# Patient Record
Sex: Female | Born: 1970 | Race: White | Hispanic: No | Marital: Married
Health system: Midwestern US, Community
[De-identification: ages and names within clinical notes are randomized; demographics above are authoritative.]

## PROBLEM LIST (undated history)

## (undated) DIAGNOSIS — F419 Anxiety disorder, unspecified: Secondary | ICD-10-CM

## (undated) DIAGNOSIS — Z1231 Encounter for screening mammogram for malignant neoplasm of breast: Secondary | ICD-10-CM

## (undated) DIAGNOSIS — N393 Stress incontinence (female) (male): Secondary | ICD-10-CM

## (undated) DIAGNOSIS — F32A Depression, unspecified: Secondary | ICD-10-CM

## (undated) DIAGNOSIS — E785 Hyperlipidemia, unspecified: Secondary | ICD-10-CM

## (undated) DIAGNOSIS — K219 Gastro-esophageal reflux disease without esophagitis: Secondary | ICD-10-CM

## (undated) DIAGNOSIS — K649 Unspecified hemorrhoids: Secondary | ICD-10-CM

## (undated) DIAGNOSIS — Z8489 Family history of other specified conditions: Secondary | ICD-10-CM

## (undated) DIAGNOSIS — J069 Acute upper respiratory infection, unspecified: Secondary | ICD-10-CM

## (undated) DIAGNOSIS — K5904 Chronic idiopathic constipation: Secondary | ICD-10-CM

## (undated) MED ORDER — FLUOXETINE 20 MG CAP
20 mg | ORAL_CAPSULE | Freq: Every day | ORAL | Status: DC
Start: ? — End: 2014-03-17

## (undated) MED ORDER — FLUOXETINE 20 MG CAP
20 mg | ORAL_CAPSULE | Freq: Every day | ORAL | Status: AC
Start: ? — End: 2013-01-19

## (undated) MED ORDER — DROSPIRENONE 3 MG-ETHINYL ESTRADIOL 0.03 MG TABLET
PACK | Freq: Every day | ORAL | Status: DC
Start: ? — End: 2014-03-17

## (undated) MED ORDER — DROSPIRENONE 3 MG-ETHINYL ESTRADIOL 0.03 MG TABLET
PACK | Freq: Every day | ORAL | Status: DC
Start: ? — End: 2013-11-05

---
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
## 2001-11-06 HISTORY — PX: HEMORROIDECTOMY: SUR656

## 2008-11-06 HISTORY — PX: INCONTINENCE SURGERY: SHX676

## 2011-11-07 HISTORY — PX: COLONOSCOPY WITH ESOPHAGOGASTRODUODENOSCOPY (EGD): SHX5779

## 2011-12-21 LAB — HCG QL SERUM: HCG, Ql.: NEGATIVE

## 2011-12-21 LAB — CBC WITH AUTOMATED DIFF
ABS. BASOPHILS: 0.1 10*3/uL (ref 0.0–0.1)
ABS. EOSINOPHILS: 0.3 10*3/uL (ref 0.0–0.4)
ABS. LYMPHOCYTES: 1.5 10*3/uL (ref 0.8–3.5)
ABS. MONOCYTES: 0.4 10*3/uL (ref 0.0–1.0)
ABS. NEUTROPHILS: 1.7 10*3/uL — ABNORMAL LOW (ref 1.8–8.0)
BASOPHILS: 1 % (ref 0–1)
EOSINOPHILS: 8 % — ABNORMAL HIGH (ref 0–7)
HCT: 39.3 % (ref 35.0–47.0)
HGB: 13.3 g/dL (ref 11.5–16.0)
LYMPHOCYTES: 38 % (ref 12–49)
MCH: 31.3 PG (ref 26.0–34.0)
MCHC: 33.8 g/dL (ref 30.0–36.5)
MCV: 92.5 FL (ref 80.0–99.0)
MONOCYTES: 11 % (ref 5–13)
NEUTROPHILS: 42 % (ref 32–75)
PLATELET: 234 10*3/uL (ref 150–400)
RBC: 4.25 M/uL (ref 3.80–5.20)
RDW: 12.5 % (ref 11.5–14.5)
WBC: 3.9 10*3/uL (ref 3.6–11.0)

## 2011-12-21 NOTE — Progress Notes (Signed)
Received message that pt call in with medicine updates.  PTA med list updated.  Pt reported no bowel prep ordered.

## 2011-12-21 NOTE — Progress Notes (Signed)
[facility]  PREOPERATIVE INSTRUCTIONS    Surgery Date:   12/28/11  Surgery arrival time given by surgeon: NO     If ???no???,[facility] staff will call you between 4 PM- 8 PM the day before surgery with    your arrival time. If your surgery is on a Monday, we will call you the preceding Friday.       Please call [phone] after 8 PM if you did not receive your arrival time.    1. Please report at the designated time to the 2nd Floor Admitting Desk.   2. You must have a responsible adult to drive you home. You need to have a responsible adult to stay with you the first 24 hours after surgery if you are going home the same day of your surgery and you should not drive a car for 24 hours following your surgery.  3. Nothing to eat or drink after midnight the night before surgery. This includes no water, gum, mints, coffee, juice, etc.  Please note special instructions, if applicable, below for medications.  4. MEDICATIONS TO TAKE THE MORNING OF SURGERY WITH A SIP OF WATER: Prilosec  5. No alcoholic beverages 24 hours before or after your surgery.  6. If you are being admitted to the hospital,please leave personal belongings/luggage in your car until you have an assigned hospital room number.  7. Stop Aspirin and/or any non-steroidal anti-inflammatory drugs (i.e. Ibuprofen, Naproxen, Advil, Aleve) as directed by your surgeon.  You may take Tylenol.        Stop herbal supplements 1 week prior to  surgery.  8. If you are currently taking Plavix, Coumadin,or any other blood-thinning/anticoagulant medication contact your surgeon for instructions.  9. Please wear comfortable clothes. Wear your glasses instead of contacts. We ask that all money, jewelry and valuables be left at home. Wear no make up, particularly mascara, the day of surgery.   10.  All body piercings, rings,and jewelry need to be removed and left at home.    Please wear your hair loose or down. Please no pony-tails, buns, or any metal hair accessories. If you shower the morning  of surgery, please do not apply any lotions, powders, or deodorants afterwards.   Do not shave any body area within 24 hours of your surgery.  11. Please follow all instructions to avoid any potential surgical cancellation.  12.  Should your physical condition change, (i.e. fever, cold, flu, etc.) please notify your surgeon as soon as possible.  13. It is important to be on time. If a situation occurs where you may be delayed, please call:  [phone]  on the day of surgery.  14. The Preadmission Testing staff can be reached at [phone].Marland KitchenBeth M  15. Special instructions: Bring insurance cards and picture id.  Valet parking.  Pain brochure reviewed and given to patient.  16. Ask Dr. Victorino Dike if a Bowel prep was ordered.  17. Call with Prilosec does and name of BCP's or bring day of surgery.    The patient was contacted  in person.   She  verbalize  understanding of all instructions does not  need reinforcement.

## 2011-12-22 LAB — TYPE AND SCREEN
ABO/Rh: A POS
Antibody Screen: NEGATIVE

## 2011-12-22 LAB — TYPE & SCREEN
ABO/Rh(D): A POS
Antibody screen: NEGATIVE

## 2011-12-28 ENCOUNTER — Inpatient Hospital Stay: Payer: BLUE CROSS/BLUE SHIELD

## 2011-12-28 LAB — HCG URINE, QL. - POC: Pregnancy test,urine (POC): NEGATIVE

## 2011-12-28 MED ORDER — FENTANYL CITRATE (PF) 50 MCG/ML IJ SOLN
50 mcg/mL | INTRAMUSCULAR | Status: DC | PRN
Start: 2011-12-28 — End: 2011-12-28

## 2011-12-28 MED ORDER — NALOXONE 0.4 MG/ML INJECTION
0.4 mg/mL | INTRAMUSCULAR | Status: DC | PRN
Start: 2011-12-28 — End: 2011-12-28

## 2011-12-28 MED ORDER — OXYCODONE-ACETAMINOPHEN 5 MG-325 MG TAB
5-325 mg | ORAL_TABLET | ORAL | Status: DC | PRN
Start: 2011-12-28 — End: 2013-11-05

## 2011-12-28 MED ORDER — BUPIVACAINE-EPINEPHRINE (PF) 0.25 %-1:200,000 IJ SOLN
0.25 %-1:200,000 | Freq: Once | INTRAMUSCULAR | Status: AC
Start: 2011-12-28 — End: 2011-12-28
  Administered 2011-12-28: 13:00:00 via SUBCUTANEOUS

## 2011-12-28 MED ORDER — PROMETHAZINE 25 MG/ML INJECTION
25 mg/mL | INTRAMUSCULAR | Status: DC | PRN
Start: 2011-12-28 — End: 2011-12-28

## 2011-12-28 MED ORDER — LIDOCAINE (PF) 10 MG/ML (1 %) IJ SOLN
10 mg/mL (1 %) | INTRAMUSCULAR | Status: DC | PRN
Start: 2011-12-28 — End: 2011-12-28

## 2011-12-28 MED ORDER — HYDROMORPHONE (PF) 1 MG/ML IJ SOLN
1 mg/mL | INTRAMUSCULAR | Status: DC | PRN
Start: 2011-12-28 — End: 2011-12-28
  Administered 2011-12-28 (×2): via INTRAVENOUS

## 2011-12-28 MED ORDER — OXYCODONE-ACETAMINOPHEN 5 MG-325 MG TAB
5-325 mg | ORAL | Status: AC
Start: 2011-12-28 — End: 2011-12-28
  Administered 2011-12-28: 15:00:00 via ORAL

## 2011-12-28 MED ORDER — FLUMAZENIL 0.1 MG/ML IV SOLN
0.1 mg/mL | INTRAVENOUS | Status: DC | PRN
Start: 2011-12-28 — End: 2011-12-28

## 2011-12-28 MED ORDER — ONDANSETRON (PF) 4 MG/2 ML INJECTION
4 mg/2 mL | INTRAMUSCULAR | Status: DC | PRN
Start: 2011-12-28 — End: 2011-12-28

## 2011-12-28 MED ADMIN — ceFAZolin in 0.9% NS (ANCEF) IVPB Soln 2 g: INTRAVENOUS | @ 13:00:00 | NDC 24200060403

## 2011-12-28 MED ADMIN — lactated ringers infusion: INTRAVENOUS | @ 12:00:00 | NDC 00409795309

## 2011-12-28 MED FILL — LACTATED RINGERS IV: INTRAVENOUS | Qty: 1000

## 2011-12-28 MED FILL — HYDROMORPHONE (PF) 1 MG/ML IJ SOLN: 1 mg/mL | INTRAMUSCULAR | Qty: 1

## 2011-12-28 MED FILL — DEXAMETHASONE SODIUM PHOSPHATE 10 MG/ML IJ SOLN: 10 mg/mL | INTRAMUSCULAR | Qty: 1

## 2011-12-28 MED FILL — LIDOCAINE (PF) 20 MG/ML (2 %) IV SYRINGE: 100 mg/5 mL (2 %) | INTRAVENOUS | Qty: 5

## 2011-12-28 MED FILL — MIDAZOLAM 1 MG/ML IJ SOLN: 1 mg/mL | INTRAMUSCULAR | Qty: 2

## 2011-12-28 MED FILL — FENTANYL CITRATE (PF) 50 MCG/ML IJ SOLN: 50 mcg/mL | INTRAMUSCULAR | Qty: 5

## 2011-12-28 MED FILL — DIPRIVAN 10 MG/ML INTRAVENOUS EMULSION: 10 mg/mL | INTRAVENOUS | Qty: 20

## 2011-12-28 MED FILL — CEFAZOLIN 1 GRAM SOLUTION FOR INJECTION: 1 gram | INTRAMUSCULAR | Qty: 2

## 2011-12-28 MED FILL — MORPHINE 10 MG/ML SYRINGE: 10 mg/mL | INTRAMUSCULAR | Qty: 1

## 2011-12-28 MED FILL — ONDANSETRON (PF) 4 MG/2 ML INJECTION: 4 mg/2 mL | INTRAMUSCULAR | Qty: 2

## 2011-12-28 MED FILL — MARCAINE-EPINEPHRINE (PF) 0.25 %-1:200,000 INJECTION SOLUTION: 0.25 %-1:200,000 | INTRAMUSCULAR | Qty: 30

## 2011-12-28 NOTE — Op Note (Signed)
TVT Operative summary    Name: Leslie Harrell   Medical Record Number: [id number]   Date of Birth: 1971-02-21  Date of Surgery: 12/28/2011    Preoperative Diagnosis: STRESS URINARY INCONTINENCE    Postoperative Diagnosis: STRESS URINARY INCONTINENCE    Surgeon:  Jossie Ng, MD     Assistant:  none    Anesthesia: General    Procedure: Procedure(s) with comments:  URETHROPEXY SLING TAPING - TENSION FREE VAGINAL TAPING OBTURATOR     Findings: Hypermobile urethra    Estimated Blood Loss:  less than 50  cc    Drains: none    Pathology /Specimens:  * No specimens in log *    DVT Prophylaxis: SCD Hose    Antibiotic Prophylaxis: Ancef:    INDICATIONS:    Informed consent was obtained for the above procedure, and the patient stated that she had no further questions.    OPERATIVE SUMMARY: The patient was prepped and draped in the dorsal lithotomy position after institution of general anesthesia. A Foley catheter was placed sterilely, and then the mid urethra identified. The sub-urethral space was injected with a dilute solution of Marcaine.    A 2 cm vertical incision was made, and dissection accomplished out to the obturator membrane. It was not punctured. The winged guide was placed and then the TVT-O device was placed on each side using the helical needle passers without difficulty. The urethra was elevated to its normal position and then the slack taken out of the TVT-ABBREVO device. It was then held in place by removing the sleeves and the sutures on each side.    The vaginal epithelium was closed with a running mattress suture of 2-0 Vicryl. The Foley catheter was removed, the bladder was inspected with cystoscopy, and all surfaces appeared to be completely normal. There was prompt spill from both ureteral orifices. There were no signs of injury to the bladder. The scope was removed and the Foley catheter replaced.     Signed by Jossie Ng, MD

## 2011-12-28 NOTE — Progress Notes (Signed)
Patient had 300cc sterile saline instilled into bladder as per order.  Foley catheter then discontinued.  Leg strap removed, left thigh has reddened area from cath secure.      Patient OOB to bathroom to void 125 cc yellow urine with scant pink tinge.  Patient given urine hat to measure output at home.  Patient aware that she should call MD with any difficulty voiding at all.  Patient encouraged to drink plenty of fluids, and affirmed this.    Patient encouraged to cough and deep breath.

## 2011-12-28 NOTE — Progress Notes (Signed)
300 cc of sterile saline instilled into bladder via foley catheter.  Patient attempted to void afterwards with no success.  Patient does not feel the urge to void.  Patient aware of order to recath if patient does not void.  Patient wants to wait to avoid being recatheterized.        Bladder scan for 288 cc. Patient has taken in 360 cc of fluid by mouth.

## 2011-12-28 NOTE — Op Note (Signed)
TVT Operative summary    Name: Leslie Harrell   Medical Record Number: [id number]   Date of Birth: 02/01/1971  Date of Surgery: 12/28/2011    Preoperative Diagnosis: STRESS URINARY INCONTINENCE    Postoperative Diagnosis: STRESS URINARY INCONTINENCE    Surgeon:  Lillion Elbert, MD     Assistant:  none    Anesthesia: General    Procedure: Procedure(s) with comments:  URETHROPEXY SLING TAPING - TENSION FREE VAGINAL TAPING OBTURATOR     Findings: Hypermobile urethra    Estimated Blood Loss:  less than 50  cc    Drains: none    Pathology /Specimens:  * No specimens in log *    DVT Prophylaxis: SCD Hose    Antibiotic Prophylaxis: Ancef:    INDICATIONS:    Informed consent was obtained for the above procedure, and the patient stated that she had no further questions.    OPERATIVE SUMMARY: The patient was prepped and draped in the dorsal lithotomy position after institution of general anesthesia. A Foley catheter was placed sterilely, and then the mid urethra identified. The sub-urethral space was injected with a dilute solution of Marcaine.    A 2 cm vertical incision was made, and dissection accomplished out to the obturator membrane. It was not punctured. The winged guide was placed and then the TVT-O device was placed on each side using the helical needle passers without difficulty. The urethra was elevated to its normal position and then the slack taken out of the TVT-ABBREVO device. It was then held in place by removing the sleeves and the sutures on each side.    The vaginal epithelium was closed with a running mattress suture of 2-0 Vicryl. The Foley catheter was removed, the bladder was inspected with cystoscopy, and all surfaces appeared to be completely normal. There was prompt spill from both ureteral orifices. There were no signs of injury to the bladder. The scope was removed and the Foley catheter replaced.     Signed by Robecca Fulgham, MD

## 2011-12-28 NOTE — Progress Notes (Signed)
Post-Anesthesia Evaluation & Assessment    Visit Vitals   Item Reading   ??? BP 111/46   ??? Pulse 90   ??? Temp 98.2 ??F (36.8 ??C)   ??? Resp 14   ??? Ht 5\' 8"  (1.727 m)   ??? Wt 61.8 kg (136 lb 3.9 oz)   ??? BMI 20.72 kg/m2   ??? SpO2 98%       Nausea/Vomiting: no nausea    Post-operative hydration adequate.    Pain score (VAS): 0    Mental status & Level of consciousness: alert and oriented x 3    Neurological status: moves all extremities, sensation grossly intact    Pulmonary status: airway patent, no supplemental oxygen required    Complications related to anesthesia: none    Patient has met all discharge requirements.    Additional comments:        Robbie Louis, MD

## 2011-12-28 NOTE — Progress Notes (Signed)
Patient still unable to void after initial foley removal at 0915.  Patient has had 480cc of p.o. Fluid.      Foley catheter inserted under sterile technique with the assist of M. Copeland, PCT.  Patient had immediate urine in catheter that is clear, pale yellow.  Patient denied any discomfort during procedure.  Foley tubing secured to l. Leg, and shows dependent drainage of 300cc +.    Patient now to wait 1 hour(12noon) to restart voiding trial.    Husband updated on development and is waiting in the Waiting Room.    Call bell at beside, drink at bedside, IV LR connected.

## 2011-12-28 NOTE — H&P (Signed)
Gynecology History and Physical    Name: Leslie Harrell MRN: [id number] SSN: [id number]    Date of Birth: 1971-06-16  Age: 41 y.o.  Sex: female       Subjective:      Chief complaint:  Urinary incontinence    Hayden is a 41 y.o. Caucasian female with a history of urinary incontinence. Previous workup included Urodynamics. Previous treatment measures included kegel exercises. She is admitted for Procedure(s) (LRB):  URETHROPEXY SLING TAPING (N/A).    The current method of family planning is oral contraceptive (estrogen/progesterone).    OB History     Grav Para Term Preterm Abortions TAB SAB Ect Mult Living                     Past Medical History   Diagnosis Date   ??? GERD (gastroesophageal reflux disease)    ??? Other ill-defined conditions      PMDD     Past Surgical History   Procedure Date   ??? Hx tonsillectomy 1986   ??? Hx appendectomy 1987   ??? Hx cesarean section 1999, 2002   ??? Hx other surgical      hemorroidectomy     Social History     Occupational History   ??? Not on file.     Social History Main Topics   ??? Smoking status: Never Smoker    ??? Smokeless tobacco: Never Used   ??? Alcohol Use: 3.5 oz/week     7 Glasses of wine per week      7/wk   ??? Drug Use: No   ??? Sexually Active:      No family history on file.     Allergies   Allergen Reactions   ??? Codeine Other (comments)     "Bad headaches"     Prior to Admission medications    Medication Sig Start Date End Date Taking? Authorizing Provider   OMEPRAZOLE (PRILOSEC PO) Take 20 mg by mouth Daily (before breakfast).   Yes Historical Provider   FLUoxetine (PROZAC) 20 mg capsule Take 20 mg by mouth nightly.     Yes Historical Provider   loratadine (CLARITIN) 10 mg tablet Take 10 mg by mouth daily.     Yes Historical Provider   multivitamin (ONE A DAY) tablet Take 1 Tab by mouth daily.     Yes Historical Provider   Bifidobacterium Infantis (ALIGN) 4 mg cap Take 1 Cap by mouth daily.     Yes Historical Provider   ETHINYL ESTRADIOL/DROSPIRENONE (ZARAH PO) Take 1 Tab by mouth  daily.     Yes Historical Provider        Review of Systems:  A comprehensive review of systems was negative except for that written in the History of Present Illness.     Objective:     Filed Vitals:    12/28/11 0634   BP: 110/62   Pulse: 66   Temp: 98.5 ??F (36.9 ??C)   Resp: 16   Height: 5\' 8"  (1.727 m)   Weight: 61.8 kg (136 lb 3.9 oz)   SpO2: 99%       Physical Exam:  Heart: Regular rate and rhythm  Lung: clear to auscultation throughout lung fields, no wheezes, no rales, no rhonchi and normal respiratory effort  Abdomen: soft, nontender, without masses or organomegaly  External Genitalia: normal general appearance  Urinary system: urethral meatus normal  Vagina: normal mucosa without prolapse or lesions  Cervix: normal appearance  Adnexa: normal  bimanual exam  Uterus: normal single, nontender    Assessment:     Urinary incontinence with pure stress incontinence by Urodynamic testing.    Plan:     Procedure(s) (LRB):  URETHROPEXY SLING TAPING (N/A)  Discussed the risks of surgery including the risks of bleeding, infection, deep vein thrombosis, and surgical injuries to internal organs including but not limited to the bowels, bladder, rectum, and female reproductive organs. The patient understands the risks; any and all questions were answered to the patient's satisfaction.    Signed By:  Jossie Ng, MD     December 28, 2011

## 2012-10-21 NOTE — Progress Notes (Signed)
Annual exam ages 43-64    Leslie Harrell is a G3 P60,  41 y.o. female WHITE OR CAUCASIAN Patient's last menstrual period was 09/24/2012..    She presents for her annual checkup. She is having no significant problems.    With regard to the Gardasil vaccine, she is older than the age for which it is FDA approved.    Menstrual status:    Her periods are moderate in flow. She is using three to five pads or tampons per day, usually regular and last 26-30 days.    She denies dysmenorrhea.    She reports no premenstrual symptoms.    Contraception:    The current method of family planning is OCP (estrogen/progesterone).    Sexual history:    She  reports that she currently engages in sexual activity. She reports using the following method of birth control/protection: Pill.    Medical conditions:    Since her last annual GYN exam about one year ago, she has not the following changes in her health history: bladder sling performed 12/2010   Pap and Mammogram History:    Her most recent Pap smear was normal, obtained 2 year(s) ago.    The patient had her mammogram today in our office.    Breast Cancer History/Substance Abuse: negative    Osteoporosis History:    Family history does not include a first or second degree relative with osteopenia or osteoporosis.    A bone density scan  never been obtained   Past Medical History   Diagnosis Date   ??? GERD (gastroesophageal reflux disease)    ??? PMDD (premenstrual dysphoric disorder)    ??? Bursitis      Past Surgical History   Procedure Laterality Date   ??? Hx tonsil and adenoidectomy  1986   ??? Hx appendectomy  1987   ??? Hx cesarean section  1999, 2002   ??? Hx cystectomy       ovarian   ??? Hx hemorrhoidectomy     ??? Hx gyn       TVT   ??? Hx urological  12/2010     bladder sling       Current Outpatient Prescriptions   Medication Sig Dispense Refill   ??? FLUoxetine (PROZAC) 20 mg capsule Take 20 mg by mouth nightly.         ??? loratadine (CLARITIN) 10 mg tablet Take 10 mg by mouth daily.          ??? multivitamin (ONE A DAY) tablet Take 1 Tab by mouth daily.         ??? Bifidobacterium Infantis (ALIGN) 4 mg cap Take 1 Cap by mouth daily.         ??? ETHINYL ESTRADIOL/DROSPIRENONE (ZARAH PO) Take 1 Tab by mouth daily.         ??? oxyCODONE-acetaminophen (PERCOCET) 5-325 mg per tablet Take 1-2 Tabs by mouth every four (4) hours as needed for Pain.  28 Tab  0   ??? OMEPRAZOLE (PRILOSEC PO) Take 20 mg by mouth Daily (before breakfast).         No current facility-administered medications for this visit.     Allergies: Codeine     Tobacco History:  reports that she has never smoked. She has never used smokeless tobacco.  Alcohol Abuse:  reports that she drinks about 3.5 ounces of alcohol per week.  Drug Abuse:  reports that she does not use illicit drugs.    Family Medical/Cancer History:  Family History   Problem Relation Age of Onset   ??? Breast Cancer Maternal Grandmother    ??? Diabetes Father    ??? Thyroid Disease Mother    ??? Cancer Mother      thyroid        Review of Systems - History obtained from the patient  Constitutional: negative for weight loss, fever, night sweats  HEENT: negative for hearing loss, earache, congestion, snoring, sorethroat  CV: negative for chest pain, palpitations, edema  Resp: negative for cough, shortness of breath, wheezing  GI: negative for change in bowel habits, abdominal pain, black or bloody stools  GU: negative for frequency, dysuria, hematuria, vaginal discharge  MSK: negative for back pain, joint pain, muscle pain  Breast: negative for breast lumps, nipple discharge, galactorrhea  Skin :negative for itching, rash, hives  Neuro: negative for dizziness, headache, confusion, weakness  Psych: negative for anxiety, depression, change in mood  Heme/lymph: negative for bleeding, bruising, pallor    Physical Exam    Ht 5\' 8"  (1.727 m)   Wt 132 lb (59.875 kg)   BMI 20.08 kg/m2   LMP 09/24/2012    Constitutional  ?? Appearance: well-nourished, well developed, alert, in no acute distress     HENT  ?? Head and Face: appears normal    Neck  ?? Inspection/Palpation: normal appearance, no masses or tenderness  ?? Lymph Nodes: no lymphadenopathy present  ?? Thyroid: gland size normal, nontender, no nodules or masses present on palpation    Chest  ?? Respiratory Effort: breathing labored  ?? Auscultation:     Cardiovascular  ?? Heart:  Auscultation:  Breasts  ?? Inspection of Breasts: breasts symmetrical, no skin changes, no discharge present, nipple appearance normal, no skin retraction present  ?? Palpation of Breasts and Axillae: no masses present on palpation, no breast tenderness  ?? Axillary Lymph Nodes: no lymphadenopathy present    Gastrointestinal  ?? Abdominal Examination: abdomen non-tender to palpation, normal bowel sounds, no masses present  ?? Liver and spleen: no hepatomegaly present, spleen not palpable  ?? Hernias: no hernias identified    Skin  ?? General Inspection: no rash, no lesions identified    Neurologic/Psychiatric  ?? Mental Status:  ?? Orientation: grossly oriented to person, place and time  ?? Mood and Affect: mood normal, affect appropriate    Genitourinary  ?? External Genitalia: normal appearance for age, no discharge present, no tenderness present, no inflammatory lesions present, no masses present, no atrophy present  ?? Vagina: normal vaginal vault without central or paravaginal defects, no discharge present, no inflammatory lesions present, no masses present  ?? Bladder: non-tender to palpation  ?? Urethra: appears normal  ?? Cervix: normal   ?? Uterus: normal size, shape and consistency  ?? Adnexa: no adnexal tenderness present, no adnexal masses present  ?? Perineum: perineum within normal limits, no evidence of trauma, no rashes or skin lesions present  ?? Anus: anus within normal limits, no hemorrhoids present  ?? Inguinal Lymph Nodes: no lymphadenopathy present    Assessment:  Routine gynecologic examination  Her current medical status is satisfactory with no evidence of significant gynecologic  issues.    Plan:  Counseled re: diet, exercise, healthy lifestyle  Return for yearly wellness visits  Rec annual mammogram  Cont OCPs for PMS

## 2012-10-21 NOTE — Patient Instructions (Signed)
Breast Self-Exam: After Your Visit  Your Care Instructions  A breast self-exam is when you check your breasts for lumps or changes. This regular exam helps you learn how your breasts normally look and feel. Most breast problems or changes are not because of cancer.  Breast self-exam is not a substitute for a mammogram. Having regular breast exams by your doctor and regular mammograms improve your chances of finding any problems with your breasts.  Some women set a time each month to do a step-by-step breast self-exam. Other women like a less formal system. They might look at their breasts as they brush their teeth, or feel their breasts once in a while in the shower.  If you notice a change in your breast, tell your doctor.  Follow-up care is a key part of your treatment and safety. Be sure to make and go to all appointments, and call your doctor if you are having problems. It???s also a good idea to know your test results and keep a list of the medicines you take.  How do you do a breast self-exam?  ?? The best time to examine your breasts is usually one week after your menstrual period begins. Your breasts should not be tender then. If you do not have periods, you might do your exam on a day of the month that is easy to remember.  ?? To examine your breasts:  ?? Remove all your clothes above the waist and lie down. When you are lying down, your breast tissue spreads evenly over your chest wall, which makes it easier to feel all your breast tissue.  ?? Use the pads???not the fingertips???of the 3 middle fingers of your left hand to check your right breast. Move your fingers slowly in small coin-sized circles that overlap.  ?? Use three levels of pressure to feel of all your breast tissue. Use light pressure to feel the tissue close to the skin surface. Use medium pressure to feel a little deeper. Use firm pressure to feel your tissue close to your breastbone and ribs. Use each pressure level to feel your breast tissue  before moving on to the next spot.  ?? Check your entire breast, moving up and down as if following a strip from the collarbone to the bra line, and from the armpit to the ribs. Repeat until you have covered the entire breast.  ?? Repeat this procedure for your left breast, using the pads of the 3 middle fingers of your right hand.  ?? To examine your breasts while in the shower:  ?? Place one arm over your head and lightly soap your breast on that side.  ?? Using the pads of your fingers, gently move your hand over your breast (in the strip pattern described above), feeling carefully for any lumps or changes.  ?? Repeat for the other breast.  ?? Have your doctor inspect anything you notice to see if you need further testing.   Where can you learn more?   Go to http://www.healthwise.net/[facility]  Enter P148 in the search box to learn more about "Breast Self-Exam: After Your Visit."   ?? 2006-2013 [employer]. Care instructions adapted under license by [place] (which disclaims liability or warranty for this information). This care instruction is for use with your licensed healthcare professional. If you have questions about a medical condition or this instruction, always ask your healthcare professional. [employer]wise, Incorporated disclaims any warranty or liability for your use of this information.  Content Version: 9.8.193578;   Last Revised: July 12, 2010

## 2013-11-05 NOTE — Patient Instructions (Addendum)
Pelvic Exam: After Your Visit  Your Care Instructions     When your doctor examines all of your pelvic organs, it's called a pelvic exam. Two good reasons to have this kind of exam are to check for sexually transmitted infections (STIs) and to get a Pap test. A Pap test is also called a Pap smear. It checks for early changes that can lead to cancer of the cervix.  Sometimes a pelvic exam is part of a regular checkup. In this case, you can do some things to make your test results as accurate as possible.  ?? Try to schedule the exam when you don't have your period.  ?? Don't use douches, tampons, or vaginal medicines, sprays, or powders for 24 hours before your exam.  ?? Don't have sex for 24 hours before your exam.  Other times, women have this kind of exam at any time of the month. This is because they have pelvic pain, bleeding, or discharge. Or they may have another pelvic problem.  Before your exam, it's important to share some information with your doctor. For example, if you are a survivor of rape or sexual abuse, you can talk about any concerns you may have. Your doctor will also want to know if you are pregnant or use birth control. And he or she will want to hear about any problems, surgeries, or procedures you have had in your pelvic area. You will also need to tell your doctor when your last period was.  Follow-up care is a key part of your treatment and safety. Be sure to make and go to all appointments, and call your doctor if you are having problems. It's also a good idea to know your test results and keep a list of the medicines you take.  How is a pelvic exam done?  ?? During a pelvic exam, you will:  ?? Take off your clothes below the waist. You will get a paper or cloth cover to put over the lower half of your body.  ?? Lie on your back on an exam table. Your feet will be raised above you. Stirrups will support your feet.  ?? The doctor will:  ?? Ask you to relax your knees. Your knees need to lean out,  toward the walls.  ?? Check the opening of your vagina for sores or swelling.  ?? Gently put a tool called a speculum into your vagina. It opens the vagina a little bit. You will feel some pressure. But if you are relaxed, it will not hurt. It lets your doctor see inside the vagina.  ?? Use a small brush, spatula, or swab to get a sample of cells, if you are having a Pap test or culture. The doctor then removes the speculum.  ?? Put on gloves and put one or two fingers of one hand into your vagina. The other hand goes on your lower belly. This lets your doctor feel your pelvic organs. You will probably feel some pressure. Try to stay relaxed.  ?? Put one gloved finger into your rectum and one into your vagina, if needed. This can also help check your pelvic organs.  This exam takes about 10 minutes. At the end, you will get a washcloth or tissue to clean your vaginal area. It's normal to have some discharge after this exam. You can then get dressed.  Some test results may be ready right away. But results from a culture or a Pap test may take several days   or a few weeks.  Why should you have a pelvic exam?  ?? You want to have recommended screening tests. This includes a Pap test.  ?? You think you have a vaginal infection. Signs include itching, burning, or unusual discharge.  ?? You might have been exposed to a sexually transmitted infection (STI), such as chlamydia or herpes.  ?? You have vaginal bleeding that is not part of your normal menstrual period.  ?? You have pain in your belly or pelvis.  ?? You have been sexually assaulted. A pelvic exam lets your doctor collect evidence and check for STIs.  ?? You are pregnant.  ?? You are having trouble getting pregnant.  What are the risks of a pelvic exam?  There are no risks from a pelvic exam.  When should you call for help?  Watch closely for changes in your health, and be sure to contact your doctor if:  ?? You have heavy bleeding or discharge from your vagina after the exam.    Where can you learn more?   Go to http://www.healthwise.net/[facility]  Enter M421 in the search box to learn more about "Pelvic Exam: After Your Visit."   ?? 2006-2014 Health[employer], Inc[employer]. Care instructions adapted under license by [place] (which disclaims liability or warranty for this information). This care instruction is for use with your licensed healthcare professional. If you have questions about a medical condition or this instruction, always ask your healthcare professional. Healthwise, Incorporated disclaims any warranty or liability for your use of this information.  Content Version: 10.2.346038; Current as of: January 15, 2013                Breast Self-Exam: After Your Visit  Your Care Instructions  A breast self-exam is when you check your breasts for lumps or changes. This regular exam helps you learn how your breasts normally look and feel. Most breast problems or changes are not because of cancer.  Breast self-exam is not a substitute for a mammogram. Having regular breast exams by your doctor and regular mammograms improve your chances of finding any problems with your breasts.  Some women set a time each month to do a step-by-step breast self-exam. Other women like a less formal system. They might look at their breasts as they brush their teeth, or feel their breasts once in a while in the shower.  If you notice a change in your breast, tell your doctor.  Follow-up care is a key part of your treatment and safety. Be sure to make and go to all appointments, and call your doctor if you are having problems. It???s also a good idea to know your test results and keep a list of the medicines you take.  How do you do a breast self-exam?  ?? The best time to examine your breasts is usually one week after your menstrual period begins. Your breasts should not be tender then. If you do not have periods, you might do your exam on a day of the month that is easy to remember.  ?? To examine your breasts:   ?? Remove all your clothes above the waist and lie down. When you are lying down, your breast tissue spreads evenly over your chest wall, which makes it easier to feel all your breast tissue.  ?? Use the pads???not the fingertips???of the 3 middle fingers of your left hand to check your right breast. Move your fingers slowly in small coin-sized circles that overlap.  ?? Use three   levels of pressure to feel of all your breast tissue. Use light pressure to feel the tissue close to the skin surface. Use medium pressure to feel a little deeper. Use firm pressure to feel your tissue close to your breastbone and ribs. Use each pressure level to feel your breast tissue before moving on to the next spot.  ?? Check your entire breast, moving up and down as if following a strip from the collarbone to the bra line, and from the armpit to the ribs. Repeat until you have covered the entire breast.  ?? Repeat this procedure for your left breast, using the pads of the 3 middle fingers of your right hand.  ?? To examine your breasts while in the shower:  ?? Place one arm over your head and lightly soap your breast on that side.  ?? Using the pads of your fingers, gently move your hand over your breast (in the strip pattern described above), feeling carefully for any lumps or changes.  ?? Repeat for the other breast.  ?? Have your doctor inspect anything you notice to see if you need further testing.   Where can you learn more?   Go to http://www.healthwise.net/[facility]  Enter P148 in the search box to learn more about "Breast Self-Exam: After Your Visit."   ?? 2006-2014 Healthwise, Incorporated. Care instructions adapted under license by [place] (which disclaims liability or warranty for this information). This care instruction is for use with your licensed healthcare professional. If you have questions about a medical condition or this instruction, always ask your healthcare professional. Healthwise, Incorporated disclaims any warranty or  liability for your use of this information.  Content Version: 10.2.346038; Current as of: January 15, 2013

## 2013-11-05 NOTE — Progress Notes (Signed)
Leslie Harrell is a G3 P16,  42 y.o. female WHITE [place] whose LMP was on  who presents for her annual checkup. She is having no significant problems.    Menstrual status:    Her periods are moderate in flow. She is using three to five pads or tampons per day, usually regular and last 26-30 days.    She denies dysmenorrhea.    She reports no premenstrual symptoms.    The patient is not using HRT.    Contraception:    The current method of family planning is OCP (estrogen/progesterone).    Sexual history:    She  reports that she currently engages in sexual activity. She reports using the following method of birth control/protection: Pill.    Medical conditions:    Since her last annual GYN exam about one year ago, she has had the following changes in her health history: none.       Pap and Mammogram History:    Her most recent Pap smear wasnormal obtained 3 year(s) ago.    The patient had her mammogram today in our office.    Breast Cancer History/Substance Abuse:    She has a family history of breast cancer.      Osteoporosis History:    Family history does not include a first or second degree relative with osteopenia or osteoporosis.    A bone density scan has not been previously obtained.         Past Medical History   Diagnosis Date   ??? GERD (gastroesophageal reflux disease)    ??? PMDD (premenstrual dysphoric disorder)    ??? Bursitis      Past Surgical History   Procedure Laterality Date   ??? Hx tonsil and adenoidectomy  1986   ??? Hx appendectomy  1987   ??? Hx cesarean section  1999, 2002   ??? Hx cystectomy       ovarian   ??? Hx hemorrhoidectomy     ??? Hx gyn       TVT   ??? Hx urological  12/2010     bladder sling     Current Outpatient Prescriptions   Medication Sig Dispense Refill   ??? drospirenone-ethinyl estradiol (OCELLA) 3-0.03 mg per tablet Take 1 Tab by mouth daily.  3 Package  4   ??? oxyCODONE-acetaminophen (PERCOCET) 5-325 mg per tablet Take 1-2 Tabs by mouth every four (4) hours as needed for Pain.  28 Tab   0   ??? OMEPRAZOLE (PRILOSEC PO) Take 20 mg by mouth Daily (before breakfast).       ??? loratadine (CLARITIN) 10 mg tablet Take 10 mg by mouth daily.         ??? multivitamin (ONE A DAY) tablet Take 1 Tab by mouth daily.         ??? Bifidobacterium Infantis (ALIGN) 4 mg cap Take 1 Cap by mouth daily.           Allergies: Codeine   History     Social History   ??? Marital Status: MARRIED     Spouse Name: N/A     Number of Children: N/A   ??? Years of Education: N/A     Occupational History   ??? Not on file.     Social History Main Topics   ??? Smoking status: Never Smoker    ??? Smokeless tobacco: Never Used   ??? Alcohol Use: 3.5 oz/week     7 Glasses of wine per week  Comment: 7/wk   ??? Drug Use: No   ??? Sexually Active: Yes -- Female partner(s)     Birth Control/ Protection: Pill     Other Topics Concern   ??? Not on file     Social History Narrative   ??? No narrative on file     Tobacco History:  reports that she has never smoked. She has never used smokeless tobacco.  Alcohol Abuse:  reports that she drinks about 3.5 ounces of alcohol per week.  Drug Abuse:  reports that she does not use illicit drugs.  Patient Active Problem List   Diagnosis Code   ??? SUI (stress urinary incontinence, female) 625.6         Review of Systems - History obtained from the patient  Constitutional: negative for weight loss, fever, night sweats  HEENT: negative for hearing loss, earache, congestion, snoring, sorethroat  CV: negative for chest pain, palpitations, edema  Resp: negative for cough, shortness of breath, wheezing  GI: negative for change in bowel habits, abdominal pain, black or bloody stools  GU: negative for frequency, dysuria, hematuria, vaginal discharge  MSK: negative for back pain, joint pain, muscle pain  Breast: negative for breast lumps, nipple discharge, galactorrhea  Skin :negative for itching, rash, hives  Neuro: negative for dizziness, headache, confusion, weakness  Psych: negative for anxiety, depression, change in mood   Heme/lymph: negative for bleeding, bruising, pallor    Physical Exam    There were no vitals taken for this visit.  Constitutional  ?? Appearance: well-nourished, well developed, alert, in no acute distress    HENT  ?? Head and Face: appears normal    Neck  ?? Inspection/Palpation: normal appearance, no masses or tenderness  ?? Lymph Nodes: no lymphadenopathy present  ?? Thyroid: gland size normal, nontender, no nodules or masses present on palpation    Chest  ?? Respiratory Effort: breathing normal  ?? Auscultation:     Cardiovascular  ?? Heart:  ?? Auscultation:     Breasts  ?? Inspection of Breasts: breasts symmetrical, no skin changes, no discharge present, nipple appearance normal, no skin retraction present  ?? Palpation of Breasts and Axillae: no masses present on palpation, no breast tenderness  ?? Axillary Lymph Nodes: no lymphadenopathy present    Gastrointestinal  ?? Abdominal Examination: abdomen non-tender to palpation, normal bowel sounds, no masses present  ?? Liver and spleen: no hepatomegaly present, spleen not palpable  ?? Hernias: no hernias identified    Skin  ?? General Inspection: no rash, no lesions identified    Neurologic/Psychiatric  ?? Mental Status:  ?? Orientation: grossly oriented to person, place and time  ?? Mood and Affect: mood normal, affect appropriate    Genitourinary  ?? External Genitalia: normal appearance for age, no discharge present, no tenderness present, no inflammatory lesions present, no masses present, no atrophy present  ?? Vagina: normal vaginal vault without central or paravaginal defects, no discharge present, no inflammatory lesions present, no masses present  ?? Bladder: non-tender to palpation  ?? Urethra: appears normal  ?? Cervix: normal   ?? Uterus: normal size, shape and consistency  ?? Adnexa: no adnexal tenderness present, no adnexal masses present  ?? Perineum: perineum within normal limits, no evidence of trauma, no rashes or skin lesions present  ?? Anus: anus within normal  limits, no hemorrhoids present  ?? Inguinal Lymph Nodes: no lymphadenopathy present    Assessment:  Routine gynecologic examination  Her current medical status is satisfactory with  no evidence of significant gynecologic issues.    Plan:  Counseled re: diet, exercise, healthy lifestyle  Return for yearly wellness visits  Rec annual mammogram  She will continue the pill for cycle control and PMS.

## 2013-11-07 NOTE — Addendum Note (Signed)
Addended by: Harrington ChallengerWELLARD, JESSICA A on: 11/07/2013 10:04 AM     Modules accepted: Level of Service

## 2013-11-08 LAB — PAP IG, HPV AND RFX HPV 16/18,45(507815)
.: 0
HPV DNA Probe, High Risk: NEGATIVE
HPV, High Risk: NEGATIVE
LABCORP 019018: 0

## 2014-03-17 MED ORDER — FLUOXETINE 20 MG CAP
20 mg | ORAL_CAPSULE | Freq: Every day | ORAL | Status: DC
Start: 2014-03-17 — End: 2014-11-09

## 2014-03-17 MED ORDER — DROSPIRENONE 3 MG-ETHINYL ESTRADIOL 0.03 MG TABLET
PACK | Freq: Every day | ORAL | Status: DC
Start: 2014-03-17 — End: 2014-11-09

## 2014-03-17 NOTE — Telephone Encounter (Signed)
Pt calling in requesting her prescription be called in to [facility]. She is no longer using mail order. Verbal orders received. Rx sent to patient preferred pharmacy.

## 2014-11-09 ENCOUNTER — Ambulatory Visit
Admit: 2014-11-09 | Discharge: 2014-11-09 | Payer: PRIVATE HEALTH INSURANCE | Attending: Obstetrics & Gynecology | Primary: Adult Health

## 2014-11-09 ENCOUNTER — Inpatient Hospital Stay: Admit: 2014-11-09 | Primary: Adult Health

## 2014-11-09 ENCOUNTER — Encounter: Admit: 2014-11-09 | Discharge: 2014-11-09 | Payer: PRIVATE HEALTH INSURANCE | Primary: Adult Health

## 2014-11-09 DIAGNOSIS — Z1231 Encounter for screening mammogram for malignant neoplasm of breast: Secondary | ICD-10-CM

## 2014-11-09 DIAGNOSIS — Z01419 Encounter for gynecological examination (general) (routine) without abnormal findings: Secondary | ICD-10-CM

## 2014-11-09 MED ORDER — DROSPIRENONE 3 MG-ETHINYL ESTRADIOL 0.03 MG TABLET
PACK | Freq: Every day | ORAL | Status: DC
Start: 2014-11-09 — End: 2016-02-15

## 2014-11-09 MED ORDER — FLUOXETINE 20 MG CAP
20 mg | ORAL_CAPSULE | Freq: Every day | ORAL | Status: DC
Start: 2014-11-09 — End: 2015-12-27

## 2014-11-09 NOTE — Addendum Note (Signed)
Addended by: Harrington ChallengerWellard, Jessica A on: 11/09/2014 11:23 AM      Modules accepted: Level of Service

## 2014-11-09 NOTE — Progress Notes (Signed)
Leslie Harrell is a G3 P92,  44 y.o. female WH[place] whose LMP was on 10/20/2014 who presents for her annual checkup. She is having some ongoing RLQ pain with intercourse which she has had for years.  Menstrual status:    Her periods are light in flow. She is using three to five pads or tampons per day, usually regular and last 26-30 days.    She denies dysmenorrhea.    She reports no premenstrual symptoms.    The patient is not using HRT.    Contraception:    The current method of family planning is OCP (estrogen/progesterone) and vasectomy.    Sexual history:    She  reports that she currently engages in sexual activity and has had female partners. She reports using the following method of birth control/protection: Pill. and vasectomy.    Medical conditions:    Since her last annual GYN exam about one year ago, she has had the following changes in her health history: none.       Pap and Mammogram History:    Her most recent Pap smear wasnormal obtained 1 year(s) ago.    The patient had her mammogram today in our office.    Breast Cancer History/Substance Abuse:    She has a family history of breast cancer.      Osteoporosis History:    Family history does not include a first or second degree relative with osteopenia or osteoporosis.    A bone density scan has not been previously obtained.         Past Medical History   Diagnosis Date   ??? GERD (gastroesophageal reflux disease)    ??? PMDD (premenstrual dysphoric disorder)    ??? Bursitis    ??? History of shingles      Past Surgical History   Procedure Laterality Date   ??? Hx tonsil and adenoidectomy  1986   ??? Hx appendectomy  1987   ??? Hx cesarean section  1999, 2002   ??? Hx cystectomy       ovarian   ??? Hx hemorrhoidectomy     ??? Hx gyn       TVT   ??? Hx urological  12/2010     bladder sling     Current Outpatient Prescriptions   Medication Sig Dispense Refill   ??? esomeprazole (NEXIUM) 20 mg capsule Take  by mouth daily.      ??? FLUoxetine (PROZAC) 20 mg capsule Take 1 Cap by mouth daily. 90 Cap 2   ??? drospirenone-ethinyl estradiol (OCELLA) 3-0.03 mg per tablet Take 1 Tab by mouth daily. 3 Package 2   ??? melatonin 1 mg tablet Take  by mouth.     ??? loratadine (CLARITIN) 10 mg tablet Take 10 mg by mouth daily.       ??? calcium carbonate (TUMS) 200 mg calcium (500 mg) chew Take 1 tablet by mouth daily.     ??? OMEPRAZOLE (PRILOSEC PO) Take 20 mg by mouth Daily (before breakfast).       Allergies: Codeine   History     Social History   ??? Marital Status: MARRIED     Spouse Name: N/A     Number of Children: N/A   ??? Years of Education: N/A     Occupational History   ??? Not on file.     Social History Main Topics   ??? Smoking status: Never Smoker    ??? Smokeless tobacco: Never Used   ???  Alcohol Use: 3.5 oz/week     7 Glasses of wine per week      Comment: 7/wk   ??? Drug Use: No   ??? Sexual Activity:     Partners: Male     Birth Control/ Protection: Pill     Other Topics Concern   ??? Not on file     Social History Narrative     Tobacco History:  reports that she has never smoked. She has never used smokeless tobacco.  Alcohol Abuse:  reports that she drinks about 3.5 oz of alcohol per week.  Drug Abuse:  reports that she does not use illicit drugs.  Patient Active Problem List   Diagnosis Code   ??? SUI (stress urinary incontinence, female) N39.3         Review of Systems - History obtained from the patient  Constitutional: negative for weight loss, fever, night sweats  HEENT: negative for hearing loss, earache, congestion, snoring, sorethroat  CV: negative for chest pain, palpitations, edema  Resp: negative for cough, shortness of breath, wheezing  GI: negative for change in bowel habits, abdominal pain, black or bloody stools  GU: negative for frequency, dysuria, hematuria, vaginal discharge  MSK: negative for back pain, joint pain, muscle pain  Breast: negative for breast lumps, nipple discharge, galactorrhea  Skin :negative for itching, rash, hives   Neuro: negative for dizziness, headache, confusion, weakness  Psych: negative for anxiety, depression, change in mood  Heme/lymph: negative for bleeding, bruising, pallor    Physical Exam    BP 94/66 mmHg   Pulse 82   Resp 18   Ht  (1.727 m)   Wt 135 lb (61.236 kg)   BMI 20.53 kg/m2   LMP 10/20/2014 (Within Days)  Constitutional  ?? Appearance: well-nourished, well developed, alert, in no acute distress    HENT  ?? Head and Face: appears normal    Neck  ?? Inspection/Palpation: normal appearance, no masses or tenderness  ?? Lymph Nodes: no lymphadenopathy present  ?? Thyroid: gland size normal, nontender, no nodules or masses present on palpation    Chest  ?? Respiratory Effort: breathing normal  ?? Auscultation:    Cardiovascular  ?? Heart:  ?? Auscultation:     Breasts  ?? Inspection of Breasts: breasts symmetrical, no skin changes, no discharge present, nipple appearance normal, no skin retraction present  ?? Palpation of Breasts and Axillae: no masses present on palpation, no breast tenderness  ?? Axillary Lymph Nodes: no lymphadenopathy present    Gastrointestinal  ?? Abdominal Examination: abdomen non-tender to palpation, normal bowel sounds, no masses present  ?? Liver and spleen: no hepatomegaly present, spleen not palpable  ?? Hernias: no hernias identified    Skin  ?? General Inspection: no rash, no lesions identified    Neurologic/Psychiatric  ?? Mental Status:  ?? Orientation: grossly oriented to person, place and time  ?? Mood and Affect: mood normal, affect appropriate    Genitourinary  ?? External Genitalia: normal appearance for age, no discharge present, no tenderness present, no inflammatory lesions present, no masses present, no atrophy present  ?? Vagina: normal vaginal vault without central or paravaginal defects, no discharge present, no inflammatory lesions present, no masses present  ?? Bladder: non-tender to palpation  ?? Urethra: appears normal  ?? Cervix: normal    ?? Uterus: normal size, shape and consistency  ?? Adnexa: no adnexal tenderness present, no adnexal masses present  ?? Perineum: perineum within normal limits, no evidence of  trauma, no rashes or skin lesions present  ?? Anus: anus within normal limits, no hemorrhoids present  ?? Inguinal Lymph Nodes: no lymphadenopathy present    Assessment:  Routine gynecologic examination  Her current medical status is satisfactory with h/o right sided dyspareunia.    Plan:  Counseled re: diet, exercise, healthy lifestyle  Return for yearly wellness visits  Rec annual mammogram  Patient Verbalized understanding  She will RTO for [place] due to RLQ dyspareunia.

## 2014-11-09 NOTE — Patient Instructions (Signed)
Well Visit, Ages 18 to 50: Care Instructions  Your Care Instructions  Physical exams can help you stay healthy. Your doctor has checked your overall health and may have suggested ways to take good care of yourself. He or she also may have recommended tests. At home, you can help prevent illness with healthy eating, regular exercise, and other steps.  Follow-up care is a key part of your treatment and safety. Be sure to make and go to all appointments, and call your doctor if you are having problems. It's also a good idea to know your test results and keep a list of the medicines you take.  How can you care for yourself at home?  ?? Reach and stay at a healthy weight. This will lower your risk for many problems, such as obesity, diabetes, heart disease, and high blood pressure.  ?? Get at least 30 minutes of physical activity on most days of the week. Walking is a good choice. You also may want to do other activities, such as running, swimming, cycling, or playing tennis or team sports. Discuss any changes in your exercise program with your doctor.  ?? Do not smoke or allow others to smoke around you. If you need help quitting, talk to your doctor about stop-smoking programs and medicines. These can increase your chances of quitting for good.  ?? Talk to your doctor about whether you have any risk factors for sexually transmitted infections (STIs). Having one sex partner (who does not have STIs and does not have sex with anyone else) is a good way to avoid these infections.  ?? Use birth control if you do not want to have children at this time. Talk with your doctor about the choices available and what might be best for you.  ?? Always wear sunscreen on exposed skin. Make sure the sunscreen blocks ultraviolet rays (both UVA and UVB) and has a sun protection factor (SPF) of at least 15. Use it every day, even when it is cloudy. Some doctors may recommend a higher SPF, such as 30.   ?? See a dentist one or two times a year for checkups and to have your teeth cleaned.  ?? Wear a seat belt in the car.  ?? Drink alcohol in moderation, if at all. That means no more than 2 drinks a day for men and 1 drink a day for women.  Follow your doctor's advice about when to have certain tests. These tests can spot problems early.  For everyone  ?? Cholesterol. Have the fat (cholesterol) in your blood tested after age 20. Your doctor will tell you how often to have this done based on your age, family history, or other things that can increase your risk for heart disease.  ?? Blood pressure. Experts suggest that healthy adults with normal blood pressure (119/79 mm Hg or below) have their blood pressure checked at least every 1 to 2 years. This can be done during a routine doctor visit. If you have slightly higher or high blood pressure, your doctor will suggest more frequent tests.  ?? Vision. Talk with your doctor about how often to have a glaucoma test.  ?? Diabetes. Ask your doctor whether you should have tests for diabetes.  ?? Colon cancer. Have a test for colon cancer at age 50. You may have one of several tests. If you are younger than 50, you may need a test earlier if you have any risk factors. Risk factors include whether you already had   a precancerous polyp removed from your colon or whether your parent, brother, sister, or child has had colon cancer.  For women  ?? Breast exam and mammogram. Talk to your doctor about when you should have a clinical breast exam and a mammogram. Medical experts differ on whether and how often women under 50 should have these tests. Your doctor can help you decide what is right for you.  ?? Pap test and pelvic exam. Begin Pap tests at age 21. A Pap test is the best way to find cervical cancer. The test often is part of a pelvic exam. Ask how often to have this test.  ?? Tests for sexually transmitted infections (STIs). Ask whether you should  have tests for STIs. You may be at risk if you have sex with more than one person, especially if your partners do not wear condoms.  For men  ?? Tests for sexually transmitted infections (STIs). Ask whether you should have tests for STIs. You may be at risk if you have sex with more than one person, especially if you do not wear a condom.  ?? Testicular cancer exam. Ask your doctor whether you should check your testicles regularly.  ?? Prostate exam. Talk to your doctor about whether you should have a blood test (called a PSA test) for prostate cancer. Experts differ on whether and when men should have this test. Some experts suggest it if you are older than 45 and are African-American or have a father or brother who got prostate cancer when he was younger than 65.  When should you call for help?  Watch closely for changes in your health, and be sure to contact your doctor if you have any problems or symptoms that concern you.   Where can you learn more?   Go to http://www.healthwise.net/[facility]  Enter P072 in the search box to learn more about "Well Visit, Ages 18 to 50: Care Instructions."   ?? 2006-2015 Healthwise, Incorporated. Care instructions adapted under license by [employer] (which disclaims liability or warranty for this information). This care instruction is for use with your licensed healthcare professional. If you have questions about a medical condition or this instruction, always ask your healthcare professional. Healthwise, Incorporated disclaims any warranty or liability for your use of this information.  Content Version: 10.7.482551; Current as of: December 26, 2013

## 2014-11-23 ENCOUNTER — Encounter: Primary: Adult Health

## 2014-11-23 ENCOUNTER — Encounter: Attending: Obstetrics & Gynecology | Primary: Adult Health

## 2014-11-23 MED ORDER — OCELLA 3 MG-0.03 MG TABLET
ORAL_TABLET | ORAL | Status: DC
Start: 2014-11-23 — End: 2016-02-15

## 2014-12-10 NOTE — Telephone Encounter (Signed)
44 year old patient calling on refill line regarding prescriptions.Patient last seen in the office on 11/09/14. Prescriptions for Prozac 20 mg and Ocella both have refills per [facility]. Patient notified of refills available; Prozac 20 mg 2/12 and Ocella 3/6 per pharmacy. Patient verbalized understanding.

## 2015-12-27 MED ORDER — FLUOXETINE 20 MG CAP
20 mg | ORAL_CAPSULE | Freq: Every day | ORAL | 1 refills | Status: DC
Start: 2015-12-27 — End: 2016-02-15

## 2015-12-27 NOTE — Telephone Encounter (Signed)
Yes

## 2015-12-27 NOTE — Telephone Encounter (Signed)
Patient advised of MD recommendations and prescription sent as per MD order to patient preferred pharmacy. Patient advised of need to keep appointment in order to get further refills. Patient verbalized understanding.

## 2015-12-27 NOTE — Telephone Encounter (Signed)
45 year old patient last seen in the office on 11/09/14. Patient reports she has had to reschedule her AE appointment till 02/15/16. Patient is requesting a refill on her Prozac. ?Ok to refill till appointment . Please advise

## 2016-01-10 ENCOUNTER — Encounter: Primary: Adult Health

## 2016-01-10 ENCOUNTER — Encounter: Attending: Obstetrics & Gynecology | Primary: Adult Health

## 2016-02-15 ENCOUNTER — Ambulatory Visit
Admit: 2016-02-15 | Discharge: 2016-02-15 | Payer: PRIVATE HEALTH INSURANCE | Attending: Obstetrics & Gynecology | Primary: Adult Health

## 2016-02-15 ENCOUNTER — Encounter: Admit: 2016-02-15 | Primary: Adult Health

## 2016-02-15 ENCOUNTER — Encounter

## 2016-02-15 ENCOUNTER — Encounter: Admit: 2016-02-15 | Discharge: 2016-02-15 | Payer: PRIVATE HEALTH INSURANCE | Primary: Adult Health

## 2016-02-15 DIAGNOSIS — Z1231 Encounter for screening mammogram for malignant neoplasm of breast: Secondary | ICD-10-CM

## 2016-02-15 DIAGNOSIS — Z01419 Encounter for gynecological examination (general) (routine) without abnormal findings: Secondary | ICD-10-CM

## 2016-02-15 MED ORDER — FLUOXETINE 20 MG CAP
20 mg | ORAL_CAPSULE | Freq: Every day | ORAL | 3 refills | Status: DC
Start: 2016-02-15 — End: 2017-04-30

## 2016-02-15 MED ORDER — DROSPIRENONE 3 MG-ETHINYL ESTRADIOL 0.03 MG TABLET
PACK | Freq: Every day | ORAL | 3 refills | Status: DC
Start: 2016-02-15 — End: 2017-03-26

## 2016-02-15 NOTE — Progress Notes (Signed)
Leslie Harrell is a G3 P43,  45 y.o. female WHITE [place] whose LMP was on  who presents for her annual checkup. She is having no new gynecological issues.    Menstrual status:    Her periods are light in flow. She is using three to five pads or tampons per day, usually regular and last 26-30 days.    She denies dysmenorrhea.    She reports no premenstrual symptoms.    The patient is not using HRT.    Contraception:    The current method of family planning is OCP (estrogen/progesterone) and vasectomy.    Sexual history:    She  reports that she currently engages in sexual activity and has had female partners. She reports using the following method of birth control/protection: Pill.    Medical conditions:    Since her last annual GYN exam about one year ago, she has had the following changes in her health history: none.       Pap and Mammogram History:    Her most recent Pap smear was negative and HPV negative obtained 2 year(s) ago.    The patient had her mammogram today in our office.    Breast Cancer History/Substance Abuse:    She has a family history of breast cancer.      Osteoporosis History:    Family history does not include a first or second degree relative with osteopenia or osteoporosis.    A bone density scan has not been previously obtained.       Past Medical History:   Diagnosis Date   ??? Bursitis    ??? GERD (gastroesophageal reflux disease)    ??? History of shingles    ??? PMDD (premenstrual dysphoric disorder)      Past Surgical History:   Procedure Laterality Date   ??? HX APPENDECTOMY  1987   ??? HX CESAREAN SECTION  1999, 2002   ??? HX CYSTECTOMY      ovarian   ??? HX GYN      TVT   ??? HX HEMORRHOIDECTOMY     ??? HX TONSIL AND ADENOIDECTOMY  1986   ??? HX UROLOGICAL  12/2010    bladder sling     Current Outpatient Prescriptions   Medication Sig Dispense Refill   ??? FLUoxetine (PROZAC) 20 mg capsule Take 1 Cap by mouth daily. 30 Cap 1   ??? OCELLA 3-0.03 mg tab TAKE 1 TAB BY MOUTH DAILY. 84 Tab 2    ??? esomeprazole (NEXIUM) 20 mg capsule Take  by mouth daily.     ??? drospirenone-ethinyl estradiol (OCELLA) 3-0.03 mg per tablet Take 1 Tab by mouth daily. 3 Package 3   ??? melatonin 1 mg tablet Take  by mouth.     ??? loratadine (CLARITIN) 10 mg tablet Take 10 mg by mouth daily.         Allergies: Codeine   Social History     Social History   ??? Marital status: MARRIED     Spouse name: N/A   ??? Number of children: N/A   ??? Years of education: N/A     Occupational History   ??? Not on file.     Social History Main Topics   ??? Smoking status: Never Smoker   ??? Smokeless tobacco: Never Used   ??? Alcohol use 3.5 oz/week     7 Glasses of wine per week      Comment: 7/wk   ??? Drug use: No   ???  Sexual activity: Yes     Partners: Male     Birth control/ protection: Pill     Other Topics Concern   ??? Not on file     Social History Narrative     Tobacco History:  reports that she has never smoked. She has never used smokeless tobacco.  Alcohol Abuse:  reports that she drinks about 3.5 oz of alcohol per week   Drug Abuse:  reports that she does not use illicit drugs.  Patient Active Problem List   Diagnosis Code   ??? SUI (stress urinary incontinence, female) N39.3         Review of Systems - History obtained from the patient  Constitutional: negative for weight loss, fever, night sweats  HEENT: negative for hearing loss, earache, congestion, snoring, sorethroat  CV: negative for chest pain, palpitations, edema  Resp: negative for cough, shortness of breath, wheezing  GI: negative for change in bowel habits, abdominal pain, black or bloody stools  GU: negative for frequency, dysuria, hematuria, vaginal discharge  MSK: negative for back pain, joint pain, muscle pain  Breast: negative for breast lumps, nipple discharge, galactorrhea  Skin :negative for itching, rash, hives  Neuro: negative for dizziness, headache, confusion, weakness  Psych: negative for anxiety, depression, change in mood  Heme/lymph: negative for bleeding, bruising, pallor     Physical Exam    Visit Vitals   ??? BP 107/64 (BP 1 Location: Left arm, BP Patient Position: Sitting)   ??? Pulse 70   ??? Resp 19   ??? Ht  (1.727 m)   ??? Wt 134 lb (60.8 kg)   ??? BMI 20.37 kg/m2     Constitutional  ?? Appearance: well-nourished, well developed, alert, in no acute distress    HENT  ?? Head and Face: appears normal    Neck  ?? Inspection/Palpation: normal appearance, no masses or tenderness  Lymph Nodes: no lymphadenopathy present    Chest  ?? Respiratory Effort: breathing normal    Breasts  ?? Inspection of Breasts: breasts symmetrical, no skin changes, no discharge present, nipple appearance normal, no skin retraction present  ?? Palpation of Breasts and Axillae: no masses present on palpation, no breast tenderness  ?? Axillary Lymph Nodes: no lymphadenopathy present    Gastrointestinal  ?? Abdominal Examination: abdomen non-tender to palpation, normal bowel sounds, no masses present  ?? Liver and spleen: no hepatomegaly present, spleen not palpable  ?? Hernias: no hernias identified    Skin  ?? General Inspection: no rash, no lesions identified    Neurologic/Psychiatric  ?? Mental Status:  ?? Orientation: grossly oriented to person, place and time  ?? Mood and Affect: mood normal, affect appropriate    Genitourinary  ?? External Genitalia: normal appearance for age, no discharge present, no tenderness present, no inflammatory lesions present, no masses present, no atrophy present  ?? Vagina: normal vaginal vault without central or paravaginal defects, no discharge present, no inflammatory lesions present, no masses present  ?? Bladder: non-tender to palpation  ?? Urethra: appears normal  ?? Cervix: normal   ?? Uterus: normal size, shape and consistency  ?? Adnexa: no adnexal tenderness present, no adnexal masses present  ?? Perineum: perineum within normal limits, no evidence of trauma, no rashes or skin lesions present  ?? Anus: anus within normal limits, no hemorrhoids present   ?? Inguinal Lymph Nodes: no lymphadenopathy present    Assessment:  Routine gynecologic examination  Her current medical status is satisfactory with no  evidence of significant gynecologic issues.    Plan:  Counseled re: diet, exercise, healthy lifestyle  Return for yearly wellness visits  Rec annual mammogram  Patient Verbalized understanding

## 2016-02-15 NOTE — Patient Instructions (Signed)
Well Visit, Ages 18 to 50: Care Instructions  Your Care Instructions  Physical exams can help you stay healthy. Your doctor has checked your overall health and may have suggested ways to take good care of yourself. He or she also may have recommended tests. At home, you can help prevent illness with healthy eating, regular exercise, and other steps.  Follow-up care is a key part of your treatment and safety. Be sure to make and go to all appointments, and call your doctor if you are having problems. It's also a good idea to know your test results and keep a list of the medicines you take.  How can you care for yourself at home?  ?? Reach and stay at a healthy weight. This will lower your risk for many problems, such as obesity, diabetes, heart disease, and high blood pressure.  ?? Get at least 30 minutes of physical activity on most days of the week. Walking is a good choice. You also may want to do other activities, such as running, swimming, cycling, or playing tennis or team sports. Discuss any changes in your exercise program with your doctor.  ?? Do not smoke or allow others to smoke around you. If you need help quitting, talk to your doctor about stop-smoking programs and medicines. These can increase your chances of quitting for good.  ?? Talk to your doctor about whether you have any risk factors for sexually transmitted infections (STIs). Having one sex partner (who does not have STIs and does not have sex with anyone else) is a good way to avoid these infections.  ?? Use birth control if you do not want to have children at this time. Talk with your doctor about the choices available and what might be best for you.  ?? Protect your skin from too much sun. When you're outdoors from 10 a.m. to 4 p.m., stay in the shade or cover up with clothing and a hat with a wide brim. Wear sunglasses that block UV rays. Even when it's cloudy, put broad-spectrum sunscreen (SPF 30 or higher) on any exposed skin.   ?? See a dentist one or two times a year for checkups and to have your teeth cleaned.  ?? Wear a seat belt in the car.  ?? Drink alcohol in moderation, if at all. That means no more than 2 drinks a day for men and 1 drink a day for women.  Follow your doctor's advice about when to have certain tests. These tests can spot problems early.  For everyone  ?? Cholesterol. Have the fat (cholesterol) in your blood tested after age 20. Your doctor will tell you how often to have this done based on your age, family history, or other things that can increase your risk for heart disease.  ?? Blood pressure. Have your blood pressure checked during a routine doctor visit. Your doctor will tell you how often to check your blood pressure based on your age, your blood pressure results, and other factors.  ?? Vision. Talk with your doctor about how often to have a glaucoma test.  ?? Diabetes. Ask your doctor whether you should have tests for diabetes.  ?? Colon cancer. Have a test for colon cancer at age 50. You may have one of several tests. If you are younger than 50, you may need a test earlier if you have any risk factors. Risk factors include whether you already had a precancerous polyp removed from your colon or whether your parent, brother,   sister, or child has had colon cancer.  For women  ?? Breast exam and mammogram. Talk to your doctor about when you should have a clinical breast exam and a mammogram. Medical experts differ on whether and how often women under 50 should have these tests. Your doctor can help you decide what is right for you.  ?? Pap test and pelvic exam. Begin Pap tests at age 21. A Pap test is the best way to find cervical cancer. The test often is part of a pelvic exam. Ask how often to have this test.  ?? Tests for sexually transmitted infections (STIs). Ask whether you should have tests for STIs. You may be at risk if you have sex with more than one person, especially if your partners do not wear condoms.   For men  ?? Tests for sexually transmitted infections (STIs). Ask whether you should have tests for STIs. You may be at risk if you have sex with more than one person, especially if you do not wear a condom.  ?? Testicular cancer exam. Ask your doctor whether you should check your testicles regularly.  ?? Prostate exam. Talk to your doctor about whether you should have a blood test (called a PSA test) for prostate cancer. Experts differ on whether and when men should have this test. Some experts suggest it if you are older than 45 and are African-American or have a father or brother who got prostate cancer when he was younger than 65.  When should you call for help?  Watch closely for changes in your health, and be sure to contact your doctor if you have any problems or symptoms that concern you.  Where can you learn more?  Go to http://www.healthwise.net/[facility].  Enter P072 in the search box to learn more about "Well Visit, Ages 18 to 50: Care Instructions."  Current as of: May 25, 2015  Content Version: 11.2  ?? 2006-2017 Healthwise, Inc[employer]. Care instructions adapted under license by [facility] (which disclaims liability or warranty for this information). If you have questions about a medical condition or this instruction, always ask your healthcare professional. [employer], Inc[employer] disclaims any warranty or liability for your use of this information.

## 2016-05-15 ENCOUNTER — Ambulatory Visit
Admit: 2016-05-15 | Discharge: 2016-05-15 | Payer: PRIVATE HEALTH INSURANCE | Attending: Adult Health | Primary: Adult Health

## 2016-05-15 DIAGNOSIS — Z0000 Encounter for general adult medical examination without abnormal findings: Secondary | ICD-10-CM

## 2016-05-15 NOTE — Progress Notes (Signed)
1. Have you been to the ER, urgent care clinic since your last visit?  Hospitalized since your last visit?No    2. Have you seen or consulted any other health care providers outside of the [facility] since your last visit?  Include any pap smears or colon screening. No    Chief Complaint   Patient presents with   ??? Establish Care   ??? Complete Physical   ??? Labs     not fasting

## 2016-05-16 LAB — METABOLIC PANEL, COMPREHENSIVE
A-G Ratio: 1.6 (ref 1.2–2.2)
ALT (SGPT): 13 IU/L (ref 0–32)
AST (SGOT): 19 IU/L (ref 0–40)
Albumin: 4 g/dL (ref 3.5–5.5)
Alk. phosphatase: 38 IU/L — ABNORMAL LOW (ref 39–117)
BUN/Creatinine ratio: 17 (ref 9–23)
BUN: 15 mg/dL (ref 6–24)
Bilirubin, total: 0.2 mg/dL (ref 0.0–1.2)
CO2: 27 mmol/L (ref 18–29)
Calcium: 9.3 mg/dL (ref 8.7–10.2)
Chloride: 98 mmol/L (ref 96–106)
Creatinine: 0.88 mg/dL (ref 0.57–1.00)
GFR est AA: 90 mL/min/{1.73_m2} (ref >59)
GFR est non-AA: 78 mL/min/{1.73_m2} (ref >59)
GLOBULIN, TOTAL: 2.5 g/dL (ref 1.5–4.5)
Glucose: 84 mg/dL (ref 65–99)
Potassium: 4.7 mmol/L (ref 3.5–5.2)
Protein, total: 6.5 g/dL (ref 6.0–8.5)
Sodium: 139 mmol/L (ref 134–144)

## 2016-05-16 LAB — CBC WITH AUTOMATED DIFF
ABS. BASOPHILS: 0 10*3/uL (ref 0.0–0.2)
ABS. EOSINOPHILS: 0.3 10*3/uL (ref 0.0–0.4)
ABS. IMM. GRANS.: 0 10*3/uL (ref 0.0–0.1)
ABS. MONOCYTES: 0.5 10*3/uL (ref 0.1–0.9)
ABS. NEUTROPHILS: 3.8 10*3/uL (ref 1.4–7.0)
Abs Lymphocytes: 1.7 10*3/uL (ref 0.7–3.1)
BASOPHILS: 1 %
EOSINOPHILS: 5 %
HCT: 39.1 % (ref 34.0–46.6)
HGB: 12.8 g/dL (ref 11.1–15.9)
IMMATURE GRANULOCYTES: 0 %
Lymphocytes: 27 %
MCH: 30.7 pg (ref 26.6–33.0)
MCHC: 32.7 g/dL (ref 31.5–35.7)
MCV: 94 fL (ref 79–97)
MONOCYTES: 8 %
NEUTROPHILS: 59 %
PLATELET: 230 10*3/uL (ref 150–379)
RBC: 4.17 x10E6/uL (ref 3.77–5.28)
RDW: 13 % (ref 12.3–15.4)
WBC: 6.4 10*3/uL (ref 3.4–10.8)

## 2016-05-16 LAB — LIPID PANEL
Cholesterol, total: 214 mg/dL — ABNORMAL HIGH (ref 100–199)
HDL Cholesterol: 64 mg/dL (ref >39)
LDL, calculated: 102 mg/dL — ABNORMAL HIGH (ref 0–99)
Triglyceride: 241 mg/dL — ABNORMAL HIGH (ref 0–149)
VLDL, calculated: 48 mg/dL — ABNORMAL HIGH (ref 5–40)

## 2016-05-16 LAB — TSH 3RD GENERATION: TSH: 1.41 u[IU]/mL (ref 0.450–4.500)

## 2016-05-16 LAB — CVD REPORT

## 2016-05-18 NOTE — Progress Notes (Signed)
Hey there, your labs are excellent for iron, thyroid, sugar, kidney and liver functions, your cholesterol is borderline high but your HDL is high enough to balance it out, no meds at this time, recheck 1 year, [facility]

## 2016-05-24 NOTE — Progress Notes (Signed)
Subjective:   45 y.o. female for Well Woman Check.   Her gyne and breast care is done elsewhere by her Ob-Gyne physician.    Patient Active Problem List    Diagnosis Date Noted   ??? SUI (stress urinary incontinence, female) 12/28/2011     Current Outpatient Prescriptions   Medication Sig Dispense Refill   ??? omeprazole (PRILOSEC) 10 mg capsule Take 10 mg by mouth daily.     ??? FLUoxetine (PROZAC) 20 mg capsule Take 1 Cap by mouth daily. 90 Cap 3   ??? drospirenone-ethinyl estradiol (OCELLA) 3-0.03 mg tab Take 1 Tab by mouth daily. 3 Package 3   ??? melatonin 1 mg tablet Take  by mouth.     ??? loratadine (CLARITIN) 10 mg tablet Take 10 mg by mouth daily.       ??? esomeprazole (NEXIUM) 20 mg capsule Take  by mouth daily.       Family History   Problem Relation Age of Onset   ??? Breast Cancer Maternal Grandmother    ??? Diabetes Father    ??? Thyroid Disease Mother    ??? Cancer Mother      thyroid     Social History   Substance Use Topics   ??? Smoking status: Never Smoker   ??? Smokeless tobacco: Never Used   ??? Alcohol use 3.5 oz/week     7 Glasses of wine per week      Comment: 7/wk             ROS: Feeling generally well. No TIA's or unusual headaches, no dysphagia.  No prolonged cough. No dyspnea or chest pain on exertion.  No abdominal pain, change in bowel habits, black or bloody stools.  No urinary tract symptoms.  No new or unusual musculoskeletal symptoms.    Specific concerns today: none.    Objective:   The patient appears well, alert, oriented x 3, in no distress.  Visit Vitals   ??? BP 104/70   ??? Pulse 67   ??? Temp 98.2 ??F (36.8 ??C) (Oral)   ??? Resp 16   ??? Ht 5\' 8"  (1.727 m)   ??? Wt 134 lb 6 oz (61 kg)   ??? LMP 04/18/2016 (Approximate)   ??? SpO2 98%   ??? BMI 20.43 kg/m2     ENT normal.  Neck supple. No adenopathy or thyromegaly. PERLA. Lungs are clear, good air entry, no wheezes, rhonchi or rales. S1 and S2 normal, no murmurs, regular rate and rhythm. Abdomen soft without tenderness,  guarding, mass or organomegaly. Extremities show no edema, normal peripheral pulses. Neurological is normal, no focal findings.  Breast and Pelvic exams are deferred.    Assessment/Plan:   Well Woman  increase physical activity, follow low fat diet, follow low salt diet, routine labs ordered  Encounter Diagnoses   Name Primary?   ??? Well adult exam Yes     Orders Placed This Encounter   ??? CBC WITH AUTOMATED DIFF   ??? METABOLIC PANEL, COMPREHENSIVE   ??? LIPID PANEL   ??? TSH 3RD GENERATION   ??? CVD REPORT   ??? omeprazole (PRILOSEC) 10 mg capsule     Given prilosec refill  Labs updated today  Recheck pending labs    I have discussed the diagnosis with the patient and the intended plan as seen in the above orders. The patient has received an after-visit summary and questions were answered concerning future plans. Patient conveyed understanding of the plan at the time of  the visit.    Loleta Rose, MSN, ANP  05/24/2016

## 2016-10-03 ENCOUNTER — Ambulatory Visit: Admit: 2016-10-03 | Payer: PRIVATE HEALTH INSURANCE | Attending: Adult Health | Primary: Adult Health

## 2016-10-03 DIAGNOSIS — M778 Other enthesopathies, not elsewhere classified: Secondary | ICD-10-CM

## 2016-10-03 NOTE — Progress Notes (Signed)
1. Have you been to the ER, urgent care clinic since your last visit?  Hospitalized since your last visit?No    2. Have you seen or consulted any other [facility] providers [facility] since your [facility] visit?  Include any pap smears or colon screening. No    Chief Complaint   Patient presents with   ??? Arm Pain     right x 2 mo   ??? Finger Pain     left middle finger at the first joint burning, off & on x 1 yr     Pt states she has right arm pain x 2 mo. She also reports left middle finger pain at the first joint that burns, off & on x 1 yrs.

## 2016-10-03 NOTE — Progress Notes (Signed)
HISTORY OF PRESENT ILLNESS  Leslie DownsStacy Lavalais is a 45 y.o. female.  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 BP 114/84   ??? Pulse 68   ??? Temp 98 ??F (36.7 ??C) (Oral)   ??? Resp 16   ??? Ht 5\' 8"  (1.727 m)   ??? Wt 137 lb 8 oz (62.4 kg)   ??? LMP 09/19/2016 (Approximate)   ??? SpO2 99%   ??? BMI 20.91 kg/m2     Physical Exam   Constitutional: She is oriented to person, place, and time.   Musculoskeletal: She exhibits tenderness.   Point tender on the right lateral epicondyl of the elbow with tenderness of the lower arm muscle on palp, good range of motion, slightly diminished grip right hand   Neurological: She is alert and oriented to person, place, and time.   Nursing note and vitals reviewed.      ASSESSMENT and PLAN  Encounter Diagnoses   Name Primary?   ??? Tendonitis of elbow, right Yes     No orders of the defined types were placed in this encounter.    Reviewed treatment plan to include aleve bid with food and ice  Encouraged use of tendonitis strap as well to help  Can inject in the future if not helping    I have discussed the diagnosis with the patient and the intended plan as seen in the above orders. The patient has received an after-visit summary and questions were answered concerning future plans. Patient conveyed understanding of the plan at the time of the visit.    Loleta RoseMelissa N. Quintarius Ferns, MSN, ANP  10/03/2016

## 2016-10-24 MED ORDER — OSELTAMIVIR PHOSPHATE 75 MG CAP
75 mg | ORAL_CAPSULE | Freq: Every day | ORAL | 0 refills | Status: AC
Start: 2016-10-24 — End: 2016-11-03

## 2016-10-24 NOTE — Telephone Encounter (Signed)
Pt here with daughter for sick appt.  Daughter dx with viral illness.  Mom requesting preventative.  Rx sent to pharm.

## 2016-11-20 ENCOUNTER — Ambulatory Visit: Admit: 2016-11-20 | Payer: PRIVATE HEALTH INSURANCE | Attending: Adult Health | Primary: Adult Health

## 2016-11-20 DIAGNOSIS — M778 Other enthesopathies, not elsewhere classified: Secondary | ICD-10-CM

## 2016-11-20 MED ORDER — METHYLPREDNISOLONE 80 MG/ML SUSP FOR INJECTION
80 mg/mL | Freq: Once | INTRAMUSCULAR | 0 refills | Status: AC
Start: 2016-11-20 — End: 2016-11-20

## 2016-11-20 NOTE — Progress Notes (Signed)
1. Have you been to the ER, urgent care clinic since your last visit?  Hospitalized since your last visit?No    2. Have you seen or consulted any other health care providers outside of the [facility] since your last visit?  Include any pap smears or colon screening. No    Chief Complaint   Patient presents with   ??? Elbow Pain     right x 4 mo, cortizone inj     Pt states she has had right elbow pain x 4 mo. She is here for a cortizone injection.

## 2016-11-20 NOTE — Progress Notes (Signed)
HISTORY OF PRESENT ILLNESS  Leslie Harrell is a 46 y.o. female.  HPI  She is here for elbow joint injection, right arm  Using advil and tendonitis strap but helping minimally  Has never been injected with cortisone    ROS  A comprehensive review of system was obtained and negative except findings in the HPI    Visit Vitals   ??? BP 98/62   ??? Pulse 67   ??? Temp 98.1 ??F (36.7 ??C) (Oral)   ??? Resp 16   ??? Ht 5\' 8"  (1.727 m)   ??? Wt 137 lb 6 oz (62.3 kg)   ??? LMP 11/14/2016 (Approximate)   ??? SpO2 95%   ??? BMI 20.89 kg/m2     Physical Exam   Constitutional: She is oriented to person, place, and time. She appears well-developed and well-nourished.        Neck: No JVD present.   Cardiovascular: Normal rate, regular rhythm and intact distal pulses.  Exam reveals no gallop and no friction rub.    No murmur heard.  Pulmonary/Chest: Effort normal and breath sounds normal. No respiratory distress. She has no wheezes.   Musculoskeletal: She exhibits tenderness. She exhibits no edema.   Tenderness of the lateral epicondyl right arm   Neurological: She is alert and oriented to person, place, and time.   Skin: Skin is warm.   Nursing note and vitals reviewed.      ASSESSMENT and PLAN  Encounter Diagnoses   Name Primary?   ??? Right elbow tendinitis Yes     Orders Placed This Encounter   ??? methylPREDNISolone acetate (DEPO-MEDROL) 80 mg/mL injection     Joint injected with 1mL depo medrol and 1mL lido under sterile technique  Dressed with bandage, leave on 24 hours  Well tolerated    Follow up prn    I have discussed the diagnosis with the patient and the intended plan as seen in the above orders. The patient has received an after-visit summary and questions were answered concerning future plans. Patient conveyed understanding of the plan at the time of the visit.    Leslie RoseMelissa N. Wisdom Rickey, MSN, ANP  11/20/2016        [facility]  OFFICE PROCEDURE PROGRESS NOTE        Chart reviewed for the following:    I, Leslie RoseMelissa N Brisa Auth, NP, have reviewed the History, Physical and updated the Allergic reactions for Leslie Harrell     TIME OUT performed immediately prior to start of procedure:   I, Leslie RoseMelissa N Caiden Monsivais, NP, have performed the following reviews on Leslie Harrell prior to the start of the procedure:            * Patient was identified by name and date of birth   * Agreement on procedure being performed was verified  * Risks and Benefits explained to the patient  * Procedure site verified and marked as necessary  * Patient was positioned for comfort  * Consent was signed and verified     Time: 1:38p      Date of procedure: 11/20/2016    Procedure performed by:  Leslie RoseMelissa N Amere Iott, NP    How tolerated by patient: tolerated the procedure well with no complications    Post Procedural Pain Scale: 2 - Hurts Little Bit    Comments: none

## 2017-01-24 ENCOUNTER — Ambulatory Visit
Admit: 2017-01-24 | Discharge: 2017-01-24 | Payer: PRIVATE HEALTH INSURANCE | Attending: Family Medicine | Primary: Adult Health

## 2017-01-24 DIAGNOSIS — J069 Acute upper respiratory infection, unspecified: Secondary | ICD-10-CM

## 2017-01-24 MED ORDER — BENZONATATE 100 MG CAP
100 mg | ORAL_CAPSULE | Freq: Three times a day (TID) | ORAL | 1 refills | Status: AC | PRN
Start: 2017-01-24 — End: 2017-01-31

## 2017-01-24 MED ORDER — LORATADINE 10 MG TAB
10 mg | ORAL_TABLET | Freq: Every day | ORAL | 11 refills | Status: DC
Start: 2017-01-24 — End: 2018-02-16

## 2017-01-24 MED ORDER — CEFDINIR 300 MG CAP
300 mg | ORAL_CAPSULE | Freq: Two times a day (BID) | ORAL | 0 refills | Status: AC
Start: 2017-01-24 — End: 2017-02-03

## 2017-01-24 NOTE — Progress Notes (Signed)
Chief Complaint   Patient presents with   ??? Cough     X 2 days SOB/weight on chest   ??? Nasal Congestion     X 1 week: now greenish drainage   ??? Ear Pain     Rt Ear cracklly. lymph nodes     1. Have you been to the ER, urgent care clinic since your last visit?  Hospitalized since your last visit?No    2. Have you seen or consulted any other health care providers outside of the [facility] since your last visit?  Include any pap smears or colon screening. No        Chief Complaint   Patient presents with   ??? Cough     X 2 days SOB/weight on chest   ??? Nasal Congestion     X 1 week: now greenish drainage   ??? Ear Pain     Rt Ear cracklly. lymph nodes     She is a 46 y.o. female who presents for evalution.     Reviewed PmHx, RxHx, FmHx, SocHx, AllgHx and updated and dated in the chart.    Patient Active Problem List    Diagnosis   ??? SUI (stress urinary incontinence, female)       Review of Systems - negative except as listed above in the HPI    Objective:     Vitals:    01/24/17 1115   BP: 117/71   Pulse: 86   Resp: 17   Temp: 99.2 ??F (37.3 ??C)   TempSrc: Oral   SpO2: 98%   Weight: 137 lb (62.1 kg)   Height: 5\' 8"  (1.727 m)     Physical Examination: General appearance - alert, well appearing, and in no distress  Nose - normal and patent, no erythema, discharge or polyps  Mouth - mucous membranes moist, pharynx normal without lesions  Neck - supple, no significant adenopathy  Chest - clear to auscultation, no wheezes, rales or rhonchi, symmetric air entry  Heart - normal rate, regular rhythm, normal S1, S2, no murmurs, rubs, clicks or gallops    Assessment/ Plan:   Diagnoses and all orders for this visit:    1. Upper respiratory tract infection, unspecified type  -     cefdinir (OMNICEF) 300 mg capsule; Take 1 Cap by mouth two (2) times a day for 10 days.  -     benzonatate (TESSALON PERLES) 100 mg capsule; Take 1 Cap by mouth three (3) times daily as needed for Cough for up to 7 days.   -     loratadine (CLARITIN) 10 mg tablet; Take 1 Tab by mouth daily for 360 days.       Follow-up Disposition:  Return if symptoms worsen or fail to improve.    I have discussed the diagnosis with the patient and the intended plan as seen in the above orders.  The patient understands and agrees with the plan. The patient has received an after-visit summary and questions were answered concerning future plans.     Medication Side Effects and Warnings were discussed with patient  Patient Labs were reviewed and or requested:  Patient Past Records were reviewed and or requested    Danley DankerAndrew Nykole Matos, M.D.    There are no Patient Instructions on file for this visit.

## 2017-01-26 NOTE — Telephone Encounter (Signed)
-----   Message from [place] C Skipwith sent at 01/26/2017 12:55 PM EDT -----  Regarding: NP [place]/Telephone  Pt would like to speak to the nurse about wet cough and back pain.  Pt stated she has already taken 5 doses of the antibiotic.  Pt best contact  [phone].

## 2017-01-26 NOTE — Telephone Encounter (Signed)
RC to pt. Informed to give ABX more time to work and continue the other meds as prescribed.   PT verbalized understanding.

## 2017-03-26 MED ORDER — DROSPIRENONE 3 MG-ETHINYL ESTRADIOL 0.03 MG TABLET
PACK | Freq: Every day | ORAL | 1 refills | Status: DC
Start: 2017-03-26 — End: 2017-04-30

## 2017-03-26 MED ORDER — FLUOXETINE 20 MG CAP
20 mg | ORAL_CAPSULE | Freq: Every day | ORAL | 1 refills | Status: DC
Start: 2017-03-26 — End: 2017-04-30

## 2017-03-26 NOTE — Telephone Encounter (Signed)
rx sent in and confirmed receipt by pharmacy.

## 2017-03-26 NOTE — Telephone Encounter (Signed)
Yes

## 2017-03-26 NOTE — Telephone Encounter (Signed)
Patient is calling to request refills for her Generic Prozac and her oral birth control pills.       Last AE:  02/15/16  Next AE/ Mammo:  04/30/17        Dr. Providence LaniusHowell,  1) Is it okay to refill her Fluoxetine 20 mg 1 cap po daily #30 with 1 refill to get her through until next AE    2) refill her Ocella to get her through until next AE as protocol (just checking since sending note regarding the Fluoxetine)      [facility] [address]

## 2017-04-30 ENCOUNTER — Encounter: Attending: Obstetrics & Gynecology | Primary: Adult Health

## 2017-04-30 ENCOUNTER — Encounter: Admit: 2017-04-30 | Primary: Adult Health

## 2017-04-30 ENCOUNTER — Ambulatory Visit
Admit: 2017-04-30 | Discharge: 2017-04-30 | Payer: PRIVATE HEALTH INSURANCE | Attending: Obstetrics & Gynecology | Primary: Adult Health

## 2017-04-30 ENCOUNTER — Encounter

## 2017-04-30 ENCOUNTER — Encounter: Admit: 2017-04-30 | Discharge: 2017-04-30 | Payer: PRIVATE HEALTH INSURANCE | Primary: Adult Health

## 2017-04-30 DIAGNOSIS — Z1231 Encounter for screening mammogram for malignant neoplasm of breast: Secondary | ICD-10-CM

## 2017-04-30 DIAGNOSIS — Z01419 Encounter for gynecological examination (general) (routine) without abnormal findings: Secondary | ICD-10-CM

## 2017-04-30 MED ORDER — DROSPIRENONE 3 MG-ETHINYL ESTRADIOL 0.03 MG TABLET
Freq: Every day | ORAL | 3 refills | Status: DC
Start: 2017-04-30 — End: 2018-04-12

## 2017-04-30 MED ORDER — FLUOXETINE 40 MG CAP
40 mg | ORAL_CAPSULE | Freq: Every day | ORAL | 3 refills | Status: DC
Start: 2017-04-30 — End: 2017-11-26

## 2017-04-30 NOTE — Progress Notes (Signed)
Annual exam ages 3240-64    Leslie Harrell is a G3 [id number],  46 y.o. female WHITE OR CAUCASIAN Patient's last menstrual period was 04/04/2017 (exact date)..    She presents for her annual checkup. She is having some increase in PMS symptoms. She stopped the pill for a while but had to restart due to worsening PMS and bleeding. Still has some PMS back on the pill.    With regard to the Gardasil vaccine, she is older than the age for which it is FDA approved.    Menstrual status:    Her periods are light, moderate in flow. She is using three to five pads or tampons per day, usually regular and last 26-30 days.    She denies dysmenorrhea.    She reports some premenstrual symptoms.    Contraception:    The current method of family planning is OCP (estrogen/progesterone).    Sexual history:    She  reports that she currently engages in sexual activity and has had female partners. She reports using the following method of birth control/protection: vasectomy    Medical conditions:    Since her last annual GYN exam about one year ago, she has not the following changes in her health history: none.     Pap and Mammogram History:    Her most recent Pap smear was normal, obtained 4 year(s) ago.    The patient had her mammogram today in our office.    Breast Cancer History/Substance Abuse: Pos    Osteoporosis History:    Family history does not include a first or second degree relative with osteopenia or osteoporosis.    A bone density scan has not been obtained.     Past Medical History:   Diagnosis Date   ??? Bursitis    ??? GERD (gastroesophageal reflux disease)    ??? History of shingles    ??? PMDD (premenstrual dysphoric disorder)      Past Surgical History:   Procedure Laterality Date   ??? HX APPENDECTOMY  1987   ??? HX CESAREAN SECTION  1999, 2002   ??? HX CYSTECTOMY      ovarian   ??? HX GYN      TVT   ??? HX HEMORRHOIDECTOMY     ??? HX TONSIL AND ADENOIDECTOMY  1986   ??? HX UROLOGICAL  12/2010    bladder sling        Current Outpatient Prescriptions   Medication Sig Dispense Refill   ??? drospirenone-ethinyl estradiol (OCELLA) 3-0.03 mg tab Take 1 Tab by mouth daily. 1 Package 1   ??? loratadine (CLARITIN) 10 mg tablet Take 1 Tab by mouth daily for 360 days. 30 Tab 11   ??? omeprazole (PRILOSEC) 10 mg capsule Take 10 mg by mouth daily.     ??? FLUoxetine (PROZAC) 20 mg capsule Take 1 Cap by mouth daily. 90 Cap 3   ??? esomeprazole (NEXIUM) 20 mg capsule Take  by mouth daily.     ??? melatonin 1 mg tablet Take  by mouth.     ??? FLUoxetine (PROZAC) 20 mg capsule Take 1 Cap by mouth daily. 30 Cap 1   ??? loratadine (CLARITIN) 10 mg tablet Take 10 mg by mouth daily.         Allergies: Codeine     Tobacco History:  reports that she has never smoked. She has never used smokeless tobacco.  Alcohol Abuse:  reports that she drinks about 3.5 oz of alcohol per week  Drug Abuse:  reports that she does not use illicit drugs.    Family Medical/Cancer History:   Family History   Problem Relation Age of Onset   ??? Breast Cancer Maternal Grandmother    ??? Diabetes Father    ??? Thyroid Disease Mother    ??? Cancer Mother      thyroid        Review of Systems - History obtained from the patient  Constitutional: negative for weight loss, fever, night sweats  HEENT: negative for hearing loss, earache, congestion, snoring, sorethroat  CV: negative for chest pain, palpitations, edema  Resp: negative for cough, shortness of breath, wheezing  GI: negative for change in bowel habits, abdominal pain, black or bloody stools  GU: negative for frequency, dysuria, hematuria, vaginal discharge  MSK: negative for back pain, joint pain, muscle pain  Breast: negative for breast lumps, nipple discharge, galactorrhea  Skin :negative for itching, rash, hives  Neuro: negative for dizziness, headache, confusion, weakness  Psych: negative for anxiety, depression, change in mood  Heme/lymph: negative for bleeding, bruising, pallor    Physical Exam    Visit Vitals   ??? BP (!) 87/58    ??? Pulse 80   ??? Ht 5\' 8"  (1.727 m)   ??? Wt 134 lb 6.4 oz (61 kg)   ??? LMP 04/04/2017 (Exact Date)   ??? BMI 20.44 kg/m2       Constitutional  ?? Appearance: well-nourished, well developed, alert, in no acute distress    HENT  ?? Head and Face: appears normal    Neck  ?? Inspection/Palpation: normal appearance, no masses or tenderness  ?? Lymph Nodes: no lymphadenopathy present  ?? Thyroid: gland size normal, nontender, no nodules or masses present on palpation    Chest  ?? Respiratory Effort: breathing unlabored  ?? Auscultation:     Cardiovascular  ?? Heart:  ?? Auscultation:     Breasts  ?? Inspection of Breasts: breasts symmetrical, no skin changes, no discharge present, nipple appearance normal, no skin retraction present  ?? Palpation of Breasts and Axillae: no masses present on palpation, no breast tenderness  ?? Axillary Lymph Nodes: no lymphadenopathy present    Gastrointestinal  ?? Abdominal Examination: abdomen non-tender to palpation, normal bowel sounds, no masses present  ?? Liver and spleen: no hepatomegaly present, spleen not palpable  ?? Hernias: no hernias identified    Genitourinary  ?? External Genitalia: normal appearance for age, no discharge present, no tenderness present, no inflammatory lesions present, no masses present, no atrophy present  ?? Vagina: normal vaginal vault without central or paravaginal defects, no discharge present, no inflammatory lesions present, no masses present  ?? Bladder: non-tender to palpation  ?? Urethra: appears normal  ?? Cervix: normal   ?? Uterus: normal size, shape and consistency  ?? Adnexa: no adnexal tenderness present, no adnexal masses present  ?? Perineum: perineum within normal limits, no evidence of trauma, no rashes or skin lesions present  ?? Anus: anus within normal limits, no hemorrhoids present  ?? Inguinal Lymph Nodes: no lymphadenopathy present    Skin  ?? General Inspection: no rash, no lesions identified    Neurologic/Psychiatric  ?? Mental Status:   ?? Orientation: grossly oriented to person, place and time  ?? Mood and Affect: mood normal, affect appropriate    Assessment:  Routine gynecologic examination  Her current medical status is satisfactory with PMS and h/o DUB.    Plan:  Counseled re: diet, exercise, healthy lifestyle  Return for yearly wellness visits  Rec annual mammogram  She will continue the pill for her PMS and bleeding. Increase her Prozac to 40 mg QD

## 2017-04-30 NOTE — Patient Instructions (Signed)
w     Well Visit, Ages 118 to 5150: Care Instructions  Your Care Instructions    Physical exams can help you stay healthy. Your doctor has checked your overall health and may have suggested ways to take good care of yourself. He or she also may have recommended tests. At home, you can help prevent illness with healthy eating, regular exercise, and other steps.  Follow-up care is a key part of your treatment and safety. Be sure to make and go to all appointments, and call your doctor if you are having problems. It's also a good idea to know your test results and keep a list of the medicines you take.  How can you care for yourself at home?  ?? Reach and stay at a healthy weight. This will lower your risk for many problems, such as obesity, diabetes, heart disease, and high blood pressure.  ?? Get at least 30 minutes of physical activity on most days of the week. Walking is a good choice. You also may want to do other activities, such as running, swimming, cycling, or playing tennis or team sports. Discuss any changes in your exercise program with your doctor.  ?? Do not smoke or allow others to smoke around you. If you need help quitting, talk to your doctor about stop-smoking programs and medicines. These can increase your chances of quitting for good.  ?? Talk to your doctor about whether you have any risk factors for sexually transmitted infections (STIs). Having one sex partner (who does not have STIs and does not have sex with anyone else) is a good way to avoid these infections.  ?? Use birth control if you do not want to have children at this time. Talk with your doctor about the choices available and what might be best for you.  ?? Protect your skin from too much sun. When you're outdoors from 10 a.m. to 4 p.m., stay in the shade or cover up with clothing and a hat with a wide brim. Wear sunglasses that block UV rays. Even when it's cloudy, put broad-spectrum sunscreen (SPF 30 or higher) on any exposed skin.   ?? See a dentist one or two times a year for checkups and to have your teeth cleaned.  ?? Wear a seat belt in the car.  ?? Drink alcohol in moderation, if at all. That means no more than 2 drinks a day for men and 1 drink a day for women.  Follow your doctor's advice about when to have certain tests. These tests can spot problems early.  For everyone  ?? Cholesterol. Have the fat (cholesterol) in your blood tested after age 48. Your doctor will tell you how often to have this done based on your age, family history, or other things that can increase your risk for heart disease.  ?? Blood pressure. Have your blood pressure checked during a routine doctor visit. Your doctor will tell you how often to check your blood pressure based on your age, your blood pressure results, and other factors.  ?? Vision. Talk with your doctor about how often to have a glaucoma test.  ?? Diabetes. Ask your doctor whether you should have tests for diabetes.  ?? Colon cancer. Have a test for colon cancer at age 48. You may have one of several tests. If you are younger than 450, you may need a test earlier if you have any risk factors. Risk factors include whether you already had a precancerous polyp removed from  your colon or whether your parent, brother, sister, or child has had colon cancer.  For women  ?? Breast exam and mammogram. Talk to your doctor about when you should have a clinical breast exam and a mammogram. Medical experts differ on whether and how often women under 50 should have these tests. Your doctor can help you decide what is right for you.  ?? Pap test and pelvic exam. Begin Pap tests at age 48. A Pap test is the best way to find cervical cancer. The test often is part of a pelvic exam. Ask how often to have this test.  ?? Tests for sexually transmitted infections (STIs). Ask whether you should have tests for STIs. You may be at risk if you have sex with more than one person, especially if your partners do not wear condoms.   For men  ?? Tests for sexually transmitted infections (STIs). Ask whether you should have tests for STIs. You may be at risk if you have sex with more than one person, especially if you do not wear a condom.  ?? Testicular cancer exam. Ask your doctor whether you should check your testicles regularly.  ?? Prostate exam. Talk to your doctor about whether you should have a blood test (called a PSA test) for prostate cancer. Experts differ on whether and when men should have this test. Some experts suggest it if you are older than 3345 and are African-American or have a father or brother who got prostate cancer when he was younger than 2965.  When should you call for help?  Watch closely for changes in your health, and be sure to contact your doctor if you have any problems or symptoms that concern you.  Where can you learn more?  Go to InsuranceStats.cahttp://www.healthwise.net/GoodHelp[employer]ctions.  Enter P072 in the search box to learn more about "Well Visit, Ages 6018 to 3250: Care Instructions."  Current as of: Mar 17, 2016  Content Version: 11.4  ?? 2006-2017 [employer]. Care instructions adapted under license by [employer] (which disclaims liability or warranty for this information). If you have questions about a medical condition or this instruction, always ask your healthcare professional. [employer] disclaims any warranty or liability for your use of this information.

## 2017-05-03 LAB — PAP IG, HPV AND RFX HPV 16/18,45(507815)
.: 0
HPV DNA Probe, High Risk: NEGATIVE
HPV, High Risk: NEGATIVE
LABCORP 019018: 0

## 2017-05-03 NOTE — Progress Notes (Signed)
Your Pap smear is normal

## 2017-05-28 ENCOUNTER — Ambulatory Visit
Admit: 2017-05-28 | Discharge: 2017-05-28 | Payer: PRIVATE HEALTH INSURANCE | Attending: Adult Health | Primary: Adult Health

## 2017-05-28 DIAGNOSIS — Z0000 Encounter for general adult medical examination without abnormal findings: Secondary | ICD-10-CM

## 2017-05-28 NOTE — Progress Notes (Signed)
Chief Complaint   Patient presents with   ??? Physical   ??? Labs     Patient in office today for cpe , pt is not fasting.  [facility]  Pt sates she increased prozac to 40 mg, about 3 wks ago.Pt states she has not noticed an significant improvement since dosage increase.    1. Have you been to the ER, urgent care clinic since your last visit?  Hospitalized since your last visit?No    2. Have you seen or consulted any other health care providers outside of the [facility] since your last visit?  Include any pap smears or colon screening. No

## 2017-05-29 DIAGNOSIS — F32A Depression, unspecified: Secondary | ICD-10-CM | POA: Insufficient documentation

## 2017-05-29 LAB — METABOLIC PANEL, COMPREHENSIVE
A-G Ratio: 1.7 (ref 1.2–2.2)
ALT (SGPT): 15 IU/L (ref 0–32)
AST (SGOT): 30 IU/L (ref 0–40)
Albumin: 4.5 g/dL (ref 3.5–5.5)
Alk. phosphatase: 42 IU/L (ref 39–117)
BUN/Creatinine ratio: 15 (ref 9–23)
BUN: 12 mg/dL (ref 6–24)
Bilirubin, total: 0.4 mg/dL (ref 0.0–1.2)
CO2: 23 mmol/L (ref 20–29)
Calcium: 9.5 mg/dL (ref 8.7–10.2)
Chloride: 98 mmol/L (ref 96–106)
Creatinine: 0.79 mg/dL (ref 0.57–1.00)
GFR est AA: 102 mL/min/{1.73_m2} (ref >59)
GFR est non-AA: 89 mL/min/{1.73_m2} (ref >59)
GLOBULIN, TOTAL: 2.7 g/dL (ref 1.5–4.5)
Glucose: 92 mg/dL (ref 65–99)
Potassium: 4.3 mmol/L (ref 3.5–5.2)
Protein, total: 7.2 g/dL (ref 6.0–8.5)
Sodium: 139 mmol/L (ref 134–144)

## 2017-05-29 LAB — CBC WITH AUTOMATED DIFF
ABS. BASOPHILS: 0 10*3/uL (ref 0.0–0.2)
ABS. EOSINOPHILS: 0.2 10*3/uL (ref 0.0–0.4)
ABS. IMM. GRANS.: 0 10*3/uL (ref 0.0–0.1)
ABS. MONOCYTES: 0.6 10*3/uL (ref 0.1–0.9)
ABS. NEUTROPHILS: 4.9 10*3/uL (ref 1.4–7.0)
Abs Lymphocytes: 1.7 10*3/uL (ref 0.7–3.1)
BASOPHILS: 0 %
EOSINOPHILS: 3 %
HCT: 39.8 % (ref 34.0–46.6)
HGB: 13.9 g/dL (ref 11.1–15.9)
IMMATURE GRANULOCYTES: 0 %
Lymphocytes: 23 %
MCH: 32 pg (ref 26.6–33.0)
MCHC: 34.9 g/dL (ref 31.5–35.7)
MCV: 92 fL (ref 79–97)
MONOCYTES: 8 %
NEUTROPHILS: 66 %
PLATELET: 243 10*3/uL (ref 150–379)
RBC: 4.35 x10E6/uL (ref 3.77–5.28)
RDW: 12.9 % (ref 12.3–15.4)
WBC: 7.4 10*3/uL (ref 3.4–10.8)

## 2017-05-29 LAB — LIPID PANEL
Cholesterol, total: 207 mg/dL — ABNORMAL HIGH (ref 100–199)
HDL Cholesterol: 69 mg/dL (ref >39)
LDL, calculated: 102 mg/dL — ABNORMAL HIGH (ref 0–99)
Triglyceride: 178 mg/dL — ABNORMAL HIGH (ref 0–149)
VLDL, calculated: 36 mg/dL (ref 5–40)

## 2017-05-29 LAB — CVD REPORT

## 2017-05-29 LAB — TSH 3RD GENERATION: TSH: 1.18 u[IU]/mL (ref 0.450–4.500)

## 2017-05-29 NOTE — Progress Notes (Signed)
Subjective:   46 y.o. female for Well Woman Check.   Her gyne and breast care is done elsewhere by her Ob-Gyne physician.    Patient Active Problem List    Diagnosis Date Noted   ??? 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 SUI (stress urinary incontinence, female) 12/28/2011     Current Outpatient Prescriptions   Medication Sig Dispense Refill   ??? FLUoxetine (PROZAC) 40 mg capsule Take 1 Cap by mouth daily. 90 Cap 3   ??? drospirenone-ethinyl estradiol (OCELLA) 3-0.03 mg tab Take 1 Tab by mouth daily. 3 Dose Pack 3   ??? loratadine (CLARITIN) 10 mg tablet Take 1 Tab by mouth daily for 360 days. 30 Tab 11   ??? esomeprazole (NEXIUM) 20 mg capsule Take  by mouth daily.     ??? melatonin 1 mg tablet Take  by mouth.     ??? omeprazole (PRILOSEC) 10 mg capsule Take 10 mg by mouth daily.       Family History   Problem Relation Age of Onset   ??? Breast Cancer Maternal Grandmother    ??? Diabetes Father    ??? Thyroid Disease Mother    ??? Cancer Mother      thyroid     Social History   Substance Use Topics   ??? Smoking status: Never Smoker   ??? Smokeless tobacco: Never Used   ??? Alcohol use 3.5 oz/week     7 Glasses of wine per week      Comment: 7/wk             ROS: Feeling generally well. No TIA's or unusual headaches, no dysphagia.  No prolonged cough. No dyspnea or chest pain on exertion.  No abdominal pain, change in bowel habits, black or bloody stools.  No urinary tract symptoms.  No new or unusual musculoskeletal symptoms.    Specific concerns today: none.    Objective:   The patient appears well, alert, oriented x 3, in no distress.  Visit Vitals   ??? BP 113/71 (BP 1 Location: Right arm, BP Patient Position: Sitting)   ??? Pulse 69   ??? Temp 98.6 ??F (37 ??C) (Oral)   ??? Resp 18   ??? Ht 5\' 8"  (1.727 m)   ??? Wt 133 lb (60.3 kg)   ??? LMP 05/01/2017   ??? SpO2 99%   ??? BMI 20.22 kg/m2     ENT normal.  Neck supple. No adenopathy or thyromegaly. PERLA. Lungs are clear, good air entry, no wheezes, rhonchi or rales. S1 and S2 normal, no  murmurs, regular rate and rhythm. Abdomen soft without tenderness, guarding, mass or organomegaly. Extremities show no edema, normal peripheral pulses. Neurological is normal, no focal findings.  Breast and Pelvic exams are deferred.    Assessment/Plan:   Well Woman  follow low fat diet, follow low salt diet, routine labs ordered  Encounter Diagnoses   Name Primary?   ??? Well adult exam Yes   ??? Mild depression (HCC)      Orders Placed This Encounter   ??? CBC WITH AUTOMATED DIFF   ??? LIPID PANEL   ??? METABOLIC PANEL, COMPREHENSIVE   ??? TSH 3RD GENERATION   ??? CVD REPORT     I have discussed the diagnosis with the patient and the intended plan as seen in the above orders. The patient has received an after-visit summary and questions were answered concerning future plans. Patient conveyed understanding of the plan at the time of the visit.  Loleta Rose, MSN, ANP  05/29/2017

## 2017-05-29 NOTE — Progress Notes (Signed)
Hey there, overall your labs look great, watch the diet for cholesterol but iron and thyroid and sugar are all in great range. No changes at this time. Recheck 1 year. Leslie Harrell

## 2017-08-01 ENCOUNTER — Ambulatory Visit
Admit: 2017-08-01 | Discharge: 2017-08-01 | Payer: PRIVATE HEALTH INSURANCE | Attending: Family Medicine | Primary: Adult Health

## 2017-08-01 DIAGNOSIS — M79652 Pain in left thigh: Secondary | ICD-10-CM

## 2017-08-01 MED ORDER — DICLOFENAC 75 MG TAB, DELAYED RELEASE
75 mg | ORAL_TABLET | Freq: Two times a day (BID) | ORAL | 2 refills | Status: AC
Start: 2017-08-01 — End: 2017-08-11

## 2017-08-01 NOTE — Progress Notes (Signed)
Patient here for left thumb injury. She also states she has a mouth sorer that has been there for 2 weeks. She states right mouth and right ear pain.    1. Have you been to the ER, urgent care clinic since your last visit?  Hospitalized since your last visit?No    2. Have you seen or consulted any other health care providers outside of the [facility] since your last visit?  Include any pap smears or colon screening. No         Chief Complaint   Patient presents with   ??? Thumb Pain     left thumb injury, swollen and pain   ??? Mouth Laceration     mouth ulcer x 2 weeks.  right mouth and ear hurting. using listerine      She is a 46 y.o. female who presents for evalution.     Reviewed PmHx, RxHx, FmHx, SocHx, AllgHx and updated and dated in the chart.    Patient Active Problem List    Diagnosis   ??? Mild depression (HCC)   ??? SUI (stress urinary incontinence, female)       Review of Systems - negative except as listed above in the HPI    Objective:     Vitals:    08/01/17 1601   BP: 113/75   Pulse: 66   Resp: 18   Temp: 98.5 ??F (36.9 ??C)   SpO2: 98%   Weight: 135 lb (61.2 kg)   Height: 5\' 8"  (1.727 m)     Physical Examination: General appearance - alert, well appearing, and in no distress  Mouth - ulcer on tongue  Left thumb at base tender      Assessment/ Plan:   Diagnoses and all orders for this visit:    1. Left thigh pain  -     diclofenac EC (VOLTAREN) 75 mg EC tablet; Take 1 Tab by mouth two (2) times a day for 10 days. Then prn    2. Mouth ulcer  -     diclofenac EC (VOLTAREN) 75 mg EC tablet; Take 1 Tab by mouth two (2) times a day for 10 days. Then prn  -salt water gargles       Follow-up Disposition:  Return if symptoms worsen or fail to improve.    I have discussed the diagnosis with the patient and the intended plan as seen in the above orders.  The patient understands and agrees with the plan. The patient has received an after-visit summary and questions were answered concerning future plans.      Medication Side Effects and Warnings were discussed with patient  Patient Labs were reviewed and or requested:  Patient Past Records were reviewed and or requested    Danley Danker, M.D.    There are no Patient Instructions on file for this visit.

## 2017-10-25 ENCOUNTER — Ambulatory Visit
Admit: 2017-10-25 | Discharge: 2017-10-25 | Payer: PRIVATE HEALTH INSURANCE | Attending: Adult Health | Primary: Adult Health

## 2017-10-25 DIAGNOSIS — J40 Bronchitis, not specified as acute or chronic: Secondary | ICD-10-CM

## 2017-10-25 MED ORDER — DOXYCYCLINE 100 MG TAB
100 mg | ORAL_TABLET | Freq: Two times a day (BID) | ORAL | 0 refills | Status: AC
Start: 2017-10-25 — End: 2017-11-04

## 2017-10-25 MED ORDER — PREDNISONE 10 MG TABLETS IN A DOSE PACK
10 mg | ORAL_TABLET | ORAL | 0 refills | Status: DC
Start: 2017-10-25 — End: 2017-11-26

## 2017-10-25 NOTE — Progress Notes (Signed)
HPI/ROS  Patient complains of bilateral ear pressure/pain. Symptoms include congestion, headache described as frontal, lightheadedness, low grade fever, post nasal drip, productive cough with  yellow colored sputum, sinus pressure, tooth pain and vertigo. Onset of symptoms was 5 days ago, gradually worsening since that time. Patient is drinking plenty of fluids..  Past history is significant for no history of pneumonia or bronchitis. Patient is non-smoker. She is using mucinex to keep the chest congestion loose.    Visit Vitals  BP 102/66   Pulse 80   Temp 98 ??F (36.7 ??C) (Oral)   Resp 16   Ht 5\' 8"  (1.727 m)   Wt 132 lb 12.8 oz (60.2 kg)   LMP 10/16/2017   SpO2 99%   BMI 20.19 kg/m??     Physical Examination:   GENERAL ASSESSMENT: well developed and well nourished  SKIN: normal color, no lesions  HEAD: normocephalic  EYES: normal eyes  EARS: external auditory canal: clear and tympanic membrane: yellow, bulging  NOSE: normal external appearance and nares patent  MOUTH: yellow exudates of OP  NECK: normal  CHEST: normal air exchange, rhonchi throughout, no wheezes, respiratory effort normal with no retractions  HEART: regular rate and rhythm, normal S1/S2, no murmurs  ABDOMEN:  not examined  EXTREMITY: not examined  NEURO: not examined    Diagnoses and all orders for this visit:    1. Bronchitis  -     predniSONE (STERAPRED DS) 10 mg dose pack; See administration instruction per 10mg  dose pack - 6 days    Other orders  -     doxycycline (ADOXA) 100 mg tablet; Take 1 Tab by mouth two (2) times a day for 10 days.      Reveiwed adr/se of medication  Push fluids, rest, suggested mucinex for congestion and drainage  Recheck 5-7 days if sx not improved.    I have discussed the diagnosis with the patient and the intended plan as seen in the above orders. The patient has received an after-visit summary and questions were answered concerning future plans. Patient conveyed understanding of the plan at the time of the visit.     Loleta RoseMelissa N. Agapita Savarino, MSN, ANP  10/25/2017

## 2017-10-25 NOTE — Progress Notes (Signed)
Chief Complaint   Patient presents with   ??? Mouth Lesions     3 sores   ??? Sinus Infection     X 2 weeks-finished Augmentin   ??? Cough     Pasty chunks of goo/Phlem     1. Have you been to the ER, urgent care clinic since your last visit?  Hospitalized since your last visit?No    2. Have you seen or consulted any other health care providers outside of the [facility] since your last visit?  Include any pap smears or colon screening. Yes [facility] [place]

## 2017-11-26 ENCOUNTER — Ambulatory Visit
Admit: 2017-11-26 | Discharge: 2017-11-26 | Payer: PRIVATE HEALTH INSURANCE | Attending: Adult Health | Primary: Adult Health

## 2017-11-26 DIAGNOSIS — F419 Anxiety disorder, unspecified: Secondary | ICD-10-CM

## 2017-11-26 MED ORDER — DULOXETINE 60 MG CAP, DELAYED RELEASE
60 mg | ORAL_CAPSULE | Freq: Every day | ORAL | 5 refills | Status: DC
Start: 2017-11-26 — End: 2018-01-17

## 2017-11-26 MED ORDER — ALPRAZOLAM 0.5 MG TAB
0.5 mg | ORAL_TABLET | Freq: Two times a day (BID) | ORAL | 0 refills | Status: DC | PRN
Start: 2017-11-26 — End: 2019-05-19

## 2017-11-26 NOTE — Progress Notes (Signed)
Chief Complaint   Patient presents with   ??? Chest Pain     reports that she feels like she has a band around her chest.    ??? GERD     more active than usual   ??? Constipation     feels more backed recently. has been taking citracel    ??? Breathing Problem     feels like she can't take a deep breath.       Has taken 0.25 xanax which helps.

## 2017-11-26 NOTE — Progress Notes (Signed)
HISTORY OF PRESENT ILLNESS  Leslie Harrell is a 47 y.o. female.  HPI  Chief Complaint   Patient presents with   ??? Chest Pain     reports that she feels like she has a band around her chest.    ??? GERD     more active than usual   ??? Constipation     feels more backed recently. has been taking citracel    ??? Breathing Problem     feels like she can't take a deep breath.       Has taken 0.25 xanax which helps.  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     Physical Exam   Constitutional: She is oriented to person, place, and time. She appears well-developed and well-nourished.        Neck: No JVD present.   Cardiovascular: Normal rate, regular rhythm and intact distal pulses. Exam reveals no gallop and no friction rub.   No murmur heard.  Pulmonary/Chest: Effort normal and breath sounds normal. No respiratory distress. She has no wheezes.   Musculoskeletal: She exhibits no edema.   Neurological: She is alert and oriented to person, place, and time.   Skin: Skin is warm.   Nursing note and vitals reviewed.      ASSESSMENT and PLAN  Encounter Diagnoses   Name Primary?   ??? Anxiety and depression Yes     Orders Placed This Encounter   ??? raNITIdine (ZANTAC) 150 mg tablet   ??? calcium citrate (CITRACAL PO)   ??? ALPRAZolam (XANAX) 0.5 mg tablet   ??? DULoxetine (CYMBALTA) 60 mg capsule     Cont to use the xanax  Add Cymbalta 60mg  a day  Reviewed how to take and what to expect  Start zantac 150mg  bid with meals prn gerd    I have discussed the diagnosis with the patient and the intended plan as seen in the above orders. The patient has received an after-visit summary  and questions were answered concerning future plans. Patient conveyed understanding of the plan at the time of the visit.    Leslie RoseMelissa N. Lesleyann Fichter, MSN, ANP  12/02/2017

## 2017-12-12 ENCOUNTER — Ambulatory Visit
Admit: 2017-12-12 | Discharge: 2017-12-12 | Payer: PRIVATE HEALTH INSURANCE | Attending: Adult Health | Primary: Adult Health

## 2017-12-12 DIAGNOSIS — F32A Depression, unspecified: Secondary | ICD-10-CM

## 2017-12-12 MED ORDER — TRAZODONE 100 MG TAB
100 mg | ORAL_TABLET | Freq: Every evening | ORAL | 0 refills | Status: DC
Start: 2017-12-12 — End: 2018-02-19

## 2017-12-12 NOTE — Progress Notes (Signed)
Chief Complaint   Patient presents with   ??? Medication Refill   ??? Rash   ??? Medication Evaluation     Pt states Cymbalta is working well  Pt in office today for medication refill    Pt has concerns for rash that comes and goes  -pt denies changing products  -pt states it itches  On back near where bra is and in the front on both sides    Pt has no other concerns

## 2017-12-12 NOTE — Progress Notes (Signed)
HISTORY OF PRESENT ILLNESS  Leslie Harrell is a 47 y.o. female.  HPI  Pt states Cymbalta is working well  Pt in office today for medication refill  No adr/se of med; stress at school and would be worse without it.    Pt has concerns for rash that comes and goes  -pt denies changing products  -pt states it itches  On back near where bra is and in the front on both sides    ROS  A comprehensive review of system was obtained and negative except findings in the HPI    Visit Vitals  BP 103/72 (BP 1 Location: Left arm, BP Patient Position: Sitting)   Pulse 80   Temp 98.4 ??F (36.9 ??C) (Oral)   Resp 18   Ht 5\' 8"  (1.727 m)   Wt 133 lb (60.3 kg)   LMP 11/13/2017   SpO2 100%   BMI 20.22 kg/m??     Physical Exam   Constitutional: She is oriented to person, place, and time. She appears well-developed and well-nourished.        Neck: No JVD present.   Cardiovascular: Normal rate, regular rhythm and intact distal pulses. Exam reveals no gallop and no friction rub.   No murmur heard.  Pulmonary/Chest: Effort normal and breath sounds normal. No respiratory distress. She has no wheezes.   Musculoskeletal: She exhibits no edema.   Neurological: She is alert and oriented to person, place, and time.   Skin: Skin is warm. Rash noted.   Isolated excoriations noted of the back at the bra   Nursing note and vitals reviewed.      ASSESSMENT and PLAN  Encounter Diagnoses   Name Primary?   ??? Anxiety and depression Yes     Orders Placed This Encounter   ??? traZODone (DESYREL) 100 mg tablet     Given trazodone to help her sleep  Reviewed how to use  No change in cymbalta  Advised cortisone for the rash    I have discussed the diagnosis with the patient and the intended plan as seen in the above orders. The patient has received an after-visit summary and questions were answered concerning future plans. Patient conveyed understanding of the plan at the time of the visit.    Loleta RoseMelissa N. Christoph Copelan, MSN, ANP  12/15/2017

## 2017-12-30 MED ORDER — CEFDINIR 300 MG CAP
300 mg | ORAL_CAPSULE | Freq: Two times a day (BID) | ORAL | 0 refills | Status: AC
Start: 2017-12-30 — End: 2018-01-09

## 2018-01-17 ENCOUNTER — Encounter

## 2018-01-17 MED ORDER — DULOXETINE 60 MG CAP, DELAYED RELEASE
60 mg | ORAL_CAPSULE | Freq: Every day | ORAL | 3 refills | Status: DC
Start: 2018-01-17 — End: 2019-01-08

## 2018-02-16 ENCOUNTER — Encounter

## 2018-02-18 MED ORDER — LORATADINE 10 MG TAB
10 mg | ORAL_TABLET | ORAL | 10 refills | Status: AC
Start: 2018-02-18 — End: ?

## 2018-02-19 MED ORDER — TRAZODONE 100 MG TAB
100 mg | ORAL_TABLET | Freq: Every evening | ORAL | 5 refills | Status: DC
Start: 2018-02-19 — End: 2018-03-05

## 2018-03-05 MED ORDER — TRAZODONE 100 MG TAB
100 mg | ORAL_TABLET | Freq: Every evening | ORAL | 5 refills | Status: DC
Start: 2018-03-05 — End: 2018-03-29

## 2018-03-05 NOTE — Telephone Encounter (Signed)
90 day supply

## 2018-03-29 MED ORDER — TRAZODONE 100 MG TAB
100 mg | ORAL_TABLET | Freq: Every evening | ORAL | 5 refills | Status: DC
Start: 2018-03-29 — End: 2019-05-19

## 2018-03-29 NOTE — Telephone Encounter (Signed)
90 day supply

## 2018-04-12 MED ORDER — DROSPIRENONE 3 MG-ETHINYL ESTRADIOL 0.03 MG TABLET
Freq: Every day | ORAL | 1 refills | Status: DC
Start: 2018-04-12 — End: 2018-04-15

## 2018-04-12 NOTE — Telephone Encounter (Signed)
rx sent and confirmed receipt.

## 2018-04-12 NOTE — Telephone Encounter (Signed)
Patient requests refill on [place].  Last AE was 04/30/17   Rescheduled for ae 05/28/18      [facility]

## 2018-04-13 ENCOUNTER — Encounter

## 2018-04-15 MED ORDER — DROSPIRENONE 3 MG-ETHINYL ESTRADIOL 0.03 MG TABLET
ORAL_TABLET | ORAL | 0 refills | Status: DC
Start: 2018-04-15 — End: 2018-05-28

## 2018-04-15 MED ORDER — LORATADINE 10 MG TAB
10 mg | ORAL_TABLET | ORAL | 10 refills | Status: DC
Start: 2018-04-15 — End: 2018-05-28

## 2018-05-28 ENCOUNTER — Ambulatory Visit
Admit: 2018-05-28 | Discharge: 2018-05-28 | Payer: PRIVATE HEALTH INSURANCE | Attending: Obstetrics & Gynecology | Primary: Adult Health

## 2018-05-28 ENCOUNTER — Encounter: Admit: 2018-05-28 | Discharge: 2018-05-28 | Payer: PRIVATE HEALTH INSURANCE | Primary: Adult Health

## 2018-05-28 ENCOUNTER — Encounter

## 2018-05-28 ENCOUNTER — Encounter: Admit: 2018-05-28 | Primary: Adult Health

## 2018-05-28 ENCOUNTER — Ambulatory Visit: Attending: Obstetrics & Gynecology | Primary: Adult Health

## 2018-05-28 DIAGNOSIS — Z01419 Encounter for gynecological examination (general) (routine) without abnormal findings: Secondary | ICD-10-CM

## 2018-05-28 DIAGNOSIS — Z1231 Encounter for screening mammogram for malignant neoplasm of breast: Secondary | ICD-10-CM

## 2018-05-28 MED ORDER — DROSPIRENONE 3 MG-ETHINYL ESTRADIOL 0.03 MG TABLET
ORAL | 3 refills | Status: DC
Start: 2018-05-28 — End: 2019-06-02

## 2018-05-28 NOTE — Progress Notes (Signed)
Your mammogram is normal

## 2018-05-28 NOTE — Progress Notes (Signed)
Leslie Harrell is a G3 P55,  47 y.o. female WHITE OR [place]UC[place] whose LMP was on  who presents for her annual checkup. She is having no significant problems.    Menstrual status:    Her periods are moderate in flow. She is using three to five pads or tampons per day, usually regular and last 26-30 days.    She denies dysmenorrhea.    She reports no premenstrual symptoms with OCPs and Cymbalta     The patient is not using HRT.    Contraception:    The current method of family planning is vasectomy and OCP (estrogen/progesterone).    Sexual history:    She  reports that she currently engages in sexual activity and has had partner(s) who are Female. She reports using the following method of birth control/protection: vasectomy    Medical conditions:    Since her last annual GYN exam about one year ago, she has had the following changes in her health history: none.       Pap and Mammogram History:    Her most recent Pap smear was negative and HPV negative obtained 1 year(s) ago.    The patient had her mammogram today in our office.    Breast Cancer History/Substance Abuse:    She has a family history of breast cancer.      Osteoporosis History:    Family history does not include a first or second degree relative with osteopenia or osteoporosis.    A bone density scan has not been previously obtained.       Past Medical History:   Diagnosis Date   ??? Bursitis    ??? GERD (gastroesophageal reflux disease)    ??? History of shingles    ??? PMDD (premenstrual dysphoric disorder)      Past Surgical History:   Procedure Laterality Date   ??? HX APPENDECTOMY  1987   ??? HX CESAREAN SECTION  1999, 2002   ??? HX CYSTECTOMY      ovarian   ??? HX GYN      TVT   ??? HX HEMORRHOIDECTOMY     ??? HX TONSIL AND ADENOIDECTOMY  1986   ??? HX UROLOGICAL  12/2010    bladder sling     Current Outpatient Medications   Medication Sig Dispense Refill   ??? drospirenone-ethinyl estradiol (YASMIN) 3-0.03 mg tab TAKE 1 TABLET BY MOUTH EVERY DAY 84 Tab 0   ??? traZODone  (DESYREL) 100 mg tablet Take 1 Tab by mouth nightly. 90 Tab 5   ??? loratadine (CLARITIN) 10 mg tablet TAKE 1 TABLET BY MOUTH EVERY DAY 30 Tab 10   ??? DULoxetine (CYMBALTA) 60 mg capsule Take 1 Cap by mouth daily. 90 Cap 3   ??? ALPRAZolam (XANAX) 0.5 mg tablet Take 1 Tab by mouth two (2) times daily as needed for Anxiety. Max Daily Amount: 1 mg. 60 Tab 0   ??? raNITIdine (ZANTAC) 150 mg tablet Take 150 mg by mouth two (2) times a day.     ??? calcium citrate (CITRACAL PO) Take  by mouth.     ??? esomeprazole (NEXIUM) 20 mg capsule Take  by mouth daily.       Allergies: Codeine   Social History     Socioeconomic History   ??? Marital status: MARRIED     Spouse name: Not on file   ??? Number of children: Not on file   ??? Years of education: Not on file   ??? Highest education  level: Not on file   Occupational History   ??? Not on file   Social Needs   ??? Financial resource strain: Not on file   ??? Food insecurity:     Worry: Not on file     Inability: Not on file   ??? Transportation needs:     Medical: Not on file     Non-medical: Not on file   Tobacco Use   ??? Smoking status: Never Smoker   ??? Smokeless tobacco: Never Used   Substance and Sexual Activity   ??? Alcohol use: Yes     Alcohol/week: 5.8 standard drinks     Types: 7 Glasses of wine per week     Comment: 7/wk   ??? Drug use: No   ??? Sexual activity: Yes     Partners: Male     Birth control/protection: None   Lifestyle   ??? Physical activity:     Days per week: Not on file     Minutes per session: Not on file   ??? Stress: Not on file   Relationships   ??? Social connections:     Talks on phone: Not on file     Gets together: Not on file     Attends religious service: Not on file     Active member of club or organization: Not on file     Attends meetings of clubs or organizations: Not on file     Relationship status: Not on file   ??? Intimate partner violence:     Fear of current or ex partner: Not on file     Emotionally abused: Not on file     Physically abused: Not on file     Forced  sexual activity: Not on file   Other Topics Concern   ??? Not on file   Social History Narrative   ??? Not on file     Tobacco History:  reports that she has never smoked. She has never used smokeless tobacco.  Alcohol Abuse:  reports that she drinks about 5.8 standard drinks of alcohol per week.  Drug Abuse:  reports that she does not use drugs.  Patient Active Problem List   Diagnosis Code   ??? SUI (stress urinary incontinence, female) N39.3   ??? 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         Review of Systems - History obtained from the patient  Constitutional: negative for weight loss, fever, night sweats  HEENT: negative for hearing loss, earache, congestion, snoring, sorethroat  CV: negative for chest pain, palpitations, edema  Resp: negative for cough, shortness of breath, wheezing  GI: negative for change in bowel habits, abdominal pain, black or bloody stools  GU: negative for frequency, dysuria, hematuria, vaginal discharge  MSK: negative for back pain, joint pain, muscle pain  Breast: negative for breast lumps, nipple discharge, galactorrhea  Skin :negative for itching, rash, hives  Neuro: negative for dizziness, headache, confusion, weakness  Psych: negative for anxiety, depression, change in mood  Heme/lymph: negative for bleeding, bruising, pallor    Physical Exam    Visit Vitals  BP 111/75 (BP 1 Location: Left arm, BP Patient Position: Sitting)   Pulse 75   Resp 19   Ht 5\' 8"  (1.727 m)   Wt 132 lb (59.9 kg)   BMI 20.07 kg/m??     Constitutional  ?? Appearance: well-nourished, well developed, alert, in no acute distress    HENT  ?? Head and Face: appears normal  Neck  ?? Inspection/Palpation: normal appearance, no masses or tenderness  Lymph Nodes: no lymphadenopathy present    Chest  ?? Respiratory Effort: breathing normal    Breasts  ?? Inspection of Breasts: breasts symmetrical, no skin changes, no discharge present, nipple appearance normal, no skin retraction present  ?? Palpation of Breasts and Axillae: no masses  present on palpation, no breast tenderness  ?? Axillary Lymph Nodes: no lymphadenopathy present    Gastrointestinal  ?? Abdominal Examination: abdomen non-tender to palpation, normal bowel sounds, no masses present  ?? Liver and spleen: no hepatomegaly present, spleen not palpable  ?? Hernias: no hernias identified    Skin  ?? General Inspection: no rash, no lesions identified    Neurologic/Psychiatric  ?? Mental Status:  ?? Orientation: grossly oriented to person, place and time  ?? Mood and Affect: mood normal, affect appropriate    Genitourinary  ?? External Genitalia: normal appearance for age, no discharge present, no tenderness present, no inflammatory lesions present, no masses present, no atrophy present  ?? Vagina: normal vaginal vault without central or paravaginal defects, no discharge present, no inflammatory lesions present, no masses present  ?? Bladder: non-tender to palpation  ?? Urethra: appears normal  ?? Cervix: normal   ?? Uterus: normal size, shape and consistency  ?? Adnexa: no adnexal tenderness present, no adnexal masses present  ?? Perineum: perineum within normal limits, no evidence of trauma, no rashes or skin lesions present  ?? Anus: anus within normal limits, no hemorrhoids present  ?? Inguinal Lymph Nodes: no lymphadenopathy present    Assessment:  Routine gynecologic examination  Her current medical status is satisfactory with no evidence of significant gynecologic issues.    Plan:  Counseled re: diet, exercise, healthy lifestyle  Return for yearly wellness visits  Rec annual mammogram  Patient Verbalized understanding

## 2018-05-28 NOTE — Progress Notes (Signed)
Leslie Harrell is a G3 P69,  47 y.o. female WHITE OR CAUC[place] whose LMP was on  who presents for her annual checkup. She is having no significant problems.    Menstrual status:    Her periods are moderate in flow. She is using three to five pads or tampons per day, usually regular and last 26-30 days.    She denies dysmenorrhea.    She reports no premenstrual symptoms with OCPs and Cymbalta     The patient is not using HRT.    Contraception:    The current method of family planning is vasectomy and OCP (estrogen/progesterone).    Sexual history:    She  reports that she currently engages in sexual activity and has had partner(s) who are Female. She reports using the following method of birth control/protection: vasectomy    Medical conditions:    Since her last annual GYN exam about one year ago, she has had the following changes in her health history: none.       Pap and Mammogram History:    Her most recent Pap smear was negative and HPV negative obtained 1 year(s) ago.    The patient had her mammogram today in our office.    Breast Cancer History/Substance Abuse:    She has a family history of breast cancer.      Osteoporosis History:    Family history does not include a first or second degree relative with osteopenia or osteoporosis.    A bone density scan has not been previously obtained.       Past Medical History:   Diagnosis Date   ??? Bursitis    ??? GERD (gastroesophageal reflux disease)    ??? History of shingles    ??? PMDD (premenstrual dysphoric disorder)      Past Surgical History:   Procedure Laterality Date   ??? HX APPENDECTOMY  1987   ??? HX CESAREAN SECTION  1999, 2002   ??? HX CYSTECTOMY      ovarian   ??? HX GYN      TVT   ??? HX HEMORRHOIDECTOMY     ??? HX TONSIL AND ADENOIDECTOMY  1986   ??? HX UROLOGICAL  12/2010    bladder sling     Current Outpatient Medications   Medication Sig Dispense Refill   ??? drospirenone-ethinyl estradiol (YASMIN) 3-0.03 mg tab TAKE 1 TABLET BY MOUTH EVERY DAY 84 Tab 0    ??? traZODone (DESYREL) 100 mg tablet Take 1 Tab by mouth nightly. 90 Tab 5   ??? loratadine (CLARITIN) 10 mg tablet TAKE 1 TABLET BY MOUTH EVERY DAY 30 Tab 10   ??? DULoxetine (CYMBALTA) 60 mg capsule Take 1 Cap by mouth daily. 90 Cap 3   ??? ALPRAZolam (XANAX) 0.5 mg tablet Take 1 Tab by mouth two (2) times daily as needed for Anxiety. Max Daily Amount: 1 mg. 60 Tab 0   ??? raNITIdine (ZANTAC) 150 mg tablet Take 150 mg by mouth two (2) times a day.     ??? calcium citrate (CITRACAL PO) Take  by mouth.     ??? esomeprazole (NEXIUM) 20 mg capsule Take  by mouth daily.       Allergies: Codeine   Social History     Socioeconomic History   ??? Marital status: MARRIED     Spouse name: Not on file   ??? Number of children: Not on file   ??? Years of education: Not on file   ??? Highest education  level: Not on file   Occupational History   ??? Not on file   Social Needs   ??? Financial resource strain: Not on file   ??? Food insecurity:     Worry: Not on file     Inability: Not on file   ??? Transportation needs:     Medical: Not on file     Non-medical: Not on file   Tobacco Use   ??? Smoking status: Never Smoker   ??? Smokeless tobacco: Never Used   Substance and Sexual Activity   ??? Alcohol use: Yes     Alcohol/week: 5.8 standard drinks     Types: 7 Glasses of wine per week     Comment: 7/wk   ??? Drug use: No   ??? Sexual activity: Yes     Partners: Male     Birth control/protection: None   Lifestyle   ??? Physical activity:     Days per week: Not on file     Minutes per session: Not on file   ??? Stress: Not on file   Relationships   ??? Social connections:     Talks on phone: Not on file     Gets together: Not on file     Attends religious service: Not on file     Active member of club or organization: Not on file     Attends meetings of clubs or organizations: Not on file     Relationship status: Not on file   ??? Intimate partner violence:     Fear of current or ex partner: Not on file     Emotionally abused: Not on file     Physically abused: Not on file      Forced sexual activity: Not on file   Other Topics Concern   ??? Not on file   Social History Narrative   ??? Not on file     Tobacco History:  reports that she has never smoked. She has never used smokeless tobacco.  Alcohol Abuse:  reports that she drinks about 5.8 standard drinks of alcohol per week.  Drug Abuse:  reports that she does not use drugs.  Patient Active Problem List   Diagnosis Code   ??? SUI (stress urinary incontinence, female) N39.3   ??? 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         Review of Systems - History obtained from the patient  Constitutional: negative for weight loss, fever, night sweats  HEENT: negative for hearing loss, earache, congestion, snoring, sorethroat  CV: negative for chest pain, palpitations, edema  Resp: negative for cough, shortness of breath, wheezing  GI: negative for change in bowel habits, abdominal pain, black or bloody stools  GU: negative for frequency, dysuria, hematuria, vaginal discharge  MSK: negative for back pain, joint pain, muscle pain  Breast: negative for breast lumps, nipple discharge, galactorrhea  Skin :negative for itching, rash, hives  Neuro: negative for dizziness, headache, confusion, weakness  Psych: negative for anxiety, depression, change in mood  Heme/lymph: negative for bleeding, bruising, pallor    Physical Exam    Visit Vitals  BP 111/75 (BP 1 Location: Left arm, BP Patient Position: Sitting)   Pulse 75   Resp 19   Ht 5\' 8"  (1.727 m)   Wt 132 lb (59.9 kg)   BMI 20.07 kg/m??     Constitutional  ?? Appearance: well-nourished, well developed, alert, in no acute distress    HENT  ?? Head and Face: appears normal  Neck  ?? Inspection/Palpation: normal appearance, no masses or tenderness  Lymph Nodes: no lymphadenopathy present    Chest  ?? Respiratory Effort: breathing normal    Breasts  ?? Inspection of Breasts: breasts symmetrical, no skin changes, no discharge present, nipple appearance normal, no skin retraction present   ?? Palpation of Breasts and Axillae: no masses present on palpation, no breast tenderness  ?? Axillary Lymph Nodes: no lymphadenopathy present    Gastrointestinal  ?? Abdominal Examination: abdomen non-tender to palpation, normal bowel sounds, no masses present  ?? Liver and spleen: no hepatomegaly present, spleen not palpable  ?? Hernias: no hernias identified    Skin  ?? General Inspection: no rash, no lesions identified    Neurologic/Psychiatric  ?? Mental Status:  ?? Orientation: grossly oriented to person, place and time  ?? Mood and Affect: mood normal, affect appropriate    Genitourinary  ?? External Genitalia: normal appearance for age, no discharge present, no tenderness present, no inflammatory lesions present, no masses present, no atrophy present  ?? Vagina: normal vaginal vault without central or paravaginal defects, no discharge present, no inflammatory lesions present, no masses present  ?? Bladder: non-tender to palpation  ?? Urethra: appears normal  ?? Cervix: normal   ?? Uterus: normal size, shape and consistency  ?? Adnexa: no adnexal tenderness present, no adnexal masses present  ?? Perineum: perineum within normal limits, no evidence of trauma, no rashes or skin lesions present  ?? Anus: anus within normal limits, no hemorrhoids present  ?? Inguinal Lymph Nodes: no lymphadenopathy present    Assessment:  Routine gynecologic examination  Her current medical status is satisfactory with no evidence of significant gynecologic issues.    Plan:  Counseled re: diet, exercise, healthy lifestyle  Return for yearly wellness visits  Rec annual mammogram  Patient Verbalized understanding

## 2018-06-17 ENCOUNTER — Encounter: Primary: Adult Health

## 2018-06-17 ENCOUNTER — Encounter: Attending: Obstetrics & Gynecology | Primary: Adult Health

## 2018-07-19 ENCOUNTER — Ambulatory Visit: Admit: 2018-07-19 | Discharge: 2018-07-19 | Payer: PRIVATE HEALTH INSURANCE | Attending: Family | Primary: Adult Health

## 2018-07-19 ENCOUNTER — Ambulatory Visit: Attending: Family | Primary: Adult Health

## 2018-07-19 DIAGNOSIS — Z0000 Encounter for general adult medical examination without abnormal findings: Secondary | ICD-10-CM

## 2018-07-19 MED ORDER — LINACLOTIDE 72 MCG CAPSULE
72 mcg | ORAL_CAPSULE | Freq: Every day | ORAL | 2 refills | Status: DC
Start: 2018-07-19 — End: 2018-07-22

## 2018-07-19 NOTE — Progress Notes (Signed)
Progress Notes by Lynford Humphrey, NP at 07/19/18 1530                Author: Lynford Humphrey, NP  Service: --  Author Type: Nurse Practitioner       Filed: 07/19/18 1632  Encounter Date: 07/19/2018  Status: Signed          Editor: Lynford Humphrey, NP (Nurse Practitioner)                 Chief Complaint       Patient presents with        ?  Physical        ?  Labs        Patient in office today for cpe and labs; last meal @ 7am.   Pt receives gyn care and mammogram with Dr.Howell @ [facility]. Last done in July.       Have c/o of constipation that was noted 3 wks ago.   Pt states she has treated with Citrucel and enema, with relief noted but felt like not as much came out that she expected.   Pt states last night, did eat salad last night and noted a VERY large BM.   Pt states abdomen is tender today.   Pt has been treating with probiotics.      Has previously had colonoscopies and endoscopies.    Has struggled with constipation for many years.       Denies any cp, sob, and dyspnea. Had heart palpitations recently. Husband lost his job in August which has been stressful. Usually this occurs during more stressful situations. Xanax helped resolve those sx last time.    Denies any ha or dizziness.    Denies any n/v. Just bloating and pain in the abdomen with acute constipation.    Denies any urinary sx.    Denies any ankle swelling.       Denies any other concerns at this time.         Chief Complaint       Patient presents with        ?  Physical        ?  Labs        she is a 47 y.o. year old  female who presents for evalution.      Reviewed PmHx, RxHx, FmHx, SocHx, AllgHx and updated and dated in the chart.      Review of Systems - negative except as listed above in the HPI        Objective:          Vitals:          07/19/18 1528        BP:  103/65     Pulse:  67     Resp:  18     Temp:  97.9 ??F (36.6 ??C)     TempSrc:  Oral     SpO2:  100%     Weight:  130 lb (59 kg)        Height:  5\' 8"  (1.727 m)         Physical Examination: General appearance - alert, well appearing, and in no distress   Mental status - normal mood, behavior, speech, dress, motor activity, and thought processes   Eyes - pupils equal and reactive, extraocular eye movements intact   Ears - bilateral TM's and external ear canals normal   Nose - normal and patent, no erythema,  discharge or polyps and normal nontender sinuses   Mouth - mucous membranes moist, pharynx normal without lesions   Neck - supple, no significant adenopathy, carotids upstroke normal bilaterally, no bruits, thyroid exam: thyroid is normal in size without nodules or tenderness   Chest - clear to auscultation, no wheezes, rales or rhonchi, symmetric air entry   Heart - normal rate, regular rhythm, normal S1, S2, no murmurs   Abdomen - soft, generalized mild tenderness to palpation, nondistended, no masses or organomegaly   bowel sounds normal   Extremities - peripheral pulses normal, no edema, no clubbing or cyanosis   Skin - normal coloration and turgor, no rashes, no suspicious skin lesions noted        Assessment/ Plan:     Diagnoses and all orders for this visit:      1. Encounter for annual physical exam   -     LIPID PANEL   -     METABOLIC PANEL, COMPREHENSIVE   -     CBC WITH AUTOMATED DIFF   -     VITAMIN D, 25 HYDROXY   -     HEMOGLOBIN A1C WITH EAG   Healthy appearing 47 year old female. Will notify results and deviate plan based on findings.    2. Screening for thyroid disorder   -     TSH 3RD GENERATION   Screening, asx.    3. Chronic idiopathic constipation   -     linaCLOtide (LINZESS) 72 mcg cap capsule; Take 1 Cap by mouth Daily (before breakfast).   Start linzess daily. Reviewed SEs/ADRs of medication. Continue other supportive measures.    4. Palpitations   Enc pt to keep a diary of sx and follow up for further evaluation if the episodes increase in frequency, severity or duration.          Follow-up and Dispositions      ??  Return if symptoms worsen or  fail to improve.                I have discussed the diagnosis with the patient and the intended plan as seen in the above orders.  The patient has received an  after-visit summary and questions were answered concerning future plans.       Medication Side Effects and Warnings were discussed with patient: yes   Patient Labs were reviewed and or requested: yes   Patient Past Records were reviewed and or requested  yes   Patient / Caregiver Understanding of treatment plan was verbalized during office visit YES      Johnna AcostaJulie Kaleah Hagemeister, FNP-C        Patient Instructions              Palpitations: Care Instructions   Your Care Instructions        Heart palpitations are the uncomfortable sensation that your heart is beating fast or irregularly. You might feel pounding or fluttering in your chest. It might feel like your heart is skipping a beat.   Although palpitations may be caused by a heart problem, they also occur because of stress, fatigue, or use of alcohol, caffeine, or nicotine. Many medicines, including diet pills, antihistamines, decongestants, and some herbal products, can cause heart  palpitations. Nearly everyone has palpitations from time to time.   Depending on your symptoms, your doctor may need to do more tests to try to find the cause of your palpitations.   Follow-up care is a key part  of your treatment and safety. Be sure to make and go to all appointments, and call your doctor if you are having problems. It's also a good idea to  know your test results and keep a list of the medicines you take.   How can you care for yourself at home?   ??  Avoid caffeine, nicotine, and excess alcohol.   ??  Do not take illegal drugs, such as methamphetamines and cocaine.   ??  Do not take weight loss or diet medicines unless you talk with your doctor first.   ??  Get plenty of sleep.   ??  Do not overeat.   ??  If you have palpitations again, take deep breaths and try to relax. This may slow a racing heart.   ??  If you start to  feel lightheaded, lie down to avoid injuries that might result if you pass out and fall down.   ??  Keep a record of your palpitations and bring it to your next doctor's appointment. Write down:   ?  The date and time.   ?  Your pulse. (If your heart is beating fast, it may be hard to count your pulse.)   ?  What you were doing when the palpitations started.   ?  How long the palpitations lasted.   ?  Any other symptoms.   ??  If an activity causes palpitations, slow down or stop. Talk to your doctor before you do that activity again.   ??  Take your medicines exactly as prescribed. Call your doctor if you think you are having a problem with your medicine.   When should you call for help?     Call 911 anytime you think you may need emergency care. For example, call if:      ??  ??  You passed out (lost consciousness).     ??  ??  You have symptoms of a heart attack. These may include:   ?  Chest pain or pressure, or a strange feeling in the chest.   ?  Sweating.   ?  Shortness of breath.   ?  Pain, pressure, or a strange feeling in the back, neck, jaw, or upper belly or in one or both shoulders or arms.   ?  Lightheadedness or sudden weakness.   ?  A fast or irregular heartbeat.   After you call 911, the operator may tell you to chew 1 adult-strength or 2 to 4 low-dose aspirin. Wait for an ambulance. Do not try to drive yourself.     ??  ??  You have symptoms of a stroke. These may include:   ?  Sudden numbness, tingling, weakness, or loss of movement in your face, arm, or leg, especially on only one side of your body.   ?  Sudden vision changes.   ?  Sudden trouble speaking.   ?  Sudden confusion or trouble understanding simple statements.   ?  Sudden problems with walking or balance.   ?  A sudden, severe headache that is different from past headaches.     ??Call your doctor now or seek immediate medical care if:      ??  ??  You have heart palpitations and:   ?  Are dizzy or lightheaded, or you feel like you may faint.   ?   Have new or increased shortness of breath.     ??Watch closely for changes in your health, and  be sure to contact your doctor if:      ??  ??  You continue to have heart palpitations.     Where can you learn more?   Go to [employer].   Enter R508 in the search box to learn more about "Palpitations: Care Instructions."   Current as of: May 27, 2017   Content Version: 12.1   ?? 2006-2019 [employer]. Care instructions adapted under license by [employer] (which disclaims liability or warranty for this information). If you have questions about a medical condition or this instruction, always ask your  healthcare professional. [employer] disclaims any warranty or liability for your use of this information.      Linaclotide (By mouth)   Linaclotide (lin-AK-loe-tide)   Treats irritable bowel syndrome with constipation and chronic idiopathic constipation.    Brand Name(s): Linzess   There may be other brand names for this medicine.   When This Medicine Should Not Be Used:   This medicine is not right for everyone. Do not use it if you had an allergic reaction to  linaclotide, or if you have a bowel or stomach blockage.   How to Use This Medicine:   Capsule   ??  Your doctor will tell you how much medicine to use. Do not use more than directed.   ??  Take this medicine on an empty stomach, at least 30 minutes before breakfast or your first meal of the day.   ??  Swallow the capsule whole. Do not crush, break, or chew it.   ??  If you have trouble swallowing the capsule, you may mix the contents with applesauce or water.   ??  To mix with applesauce: Open the capsule and sprinkle the beads on 1 teaspoonful of applesauce. Swallow the mixture immediately without  chewing. Do not store it for later use.   ??  To mix with water: Open the capsule and sprinkle the beads into a cup with 30 mL (1 ounce) of water. Gently swirl for at least 10 seconds. Swallow the entire  mixture immediately. Add another 30 mL (1 ounce) of water to the cup and swirl. Drink the water  right away to make sure all of the medicine is taken. Do not store it for later use.   ??  The water mixture may also be used with a nasogastric or gastric feeding tube. After the mixture is given, flush the tube with an additional 10 mL (2 teaspoons) of water.   ??  This medicine should come with a Medication Guide. Ask your pharmacist for a copy if you do not have one.   ??  Missed dose: Skip the missed dose and go back to your regular dosing schedule. Do not double doses.   ??  Store the medicine in a closed container at room temperature, away from heat, moisture, and direct light. Keep the medicine in the original bottle until you are ready to use it. The bottle contains a desiccant packet that helps protect the capsules from  moisture. Do not remove the packet from the bottle.   Drugs and Foods to Avoid:       Ask your doctor or pharmacist before using any other medicine, including over-the-counter medicines, vitamins, and herbal products.        Warnings While Using This Medicine:    ??  Tell your doctor if you are pregnant or breastfeeding, or if you have other stomach or bowel problems.   ??  Your doctor will check your progress and the effects of this medicine at regular visits. Keep all appointments.   ??  Keep all medicine out of the reach of children. Never share your medicine with anyone.   Possible Side Effects While Using This Medicine:    Call your doctor right away if you notice any of these side effects:   ??  Allergic reaction: Itching or hives, swelling in your face or hands, swelling or tingling in your mouth or throat, chest  tightness, trouble breathing   ??  Bloody or black, tarry stools   ??  Lightheadedness, dizziness, fainting   ??  Severe diarrhea or stomach pain   If you notice these less serious side effects, talk with your doctor:    ??  Mild diarrhea   If you notice other side effects that you think  are caused by this medicine, tell your doctor.    Call your doctor for medical advice about side effects. You may report side effects to FDA at 1-800-FDA-1088   ?? 2017 [facility] Information is for End User's use only and may not be sold, redistributed or otherwise used for commercial purposes.   The above information is an educational aid only. It is not intended as medical advice for individual conditions or treatments. Talk to your doctor, nurse or pharmacist before following any medical regimen to see if it is safe and effective for you.

## 2018-07-19 NOTE — Progress Notes (Signed)
 Chief Complaint   Patient presents with   . Physical   . Labs     Patient in office today for cpe and labs;last meal @ 7am.  Pt receives gyn care and mammogram with Dr.Howell @ [facility].    Have c/o of constipation that was noted 3 wks ago.  Pt states she has treated with Citrucel and enema,with an relief noted.  Pt states last night,did eat salad last night and noted large bm.  Pt states abdomen is tender today.  Pt has been treating with probiotics.      1. Have you been to the ER, urgent care clinic since your last visit?  Hospitalized since your last visit?No    2. Have you seen or consulted any other health care providers outside of the [facility] since your last visit?  Include any pap smears or colon screening. No

## 2018-07-19 NOTE — Progress Notes (Signed)
The following message was sent to pt via mychart portal in reference to lab results:    Good evening Leslie Harrell,     Attached are the results of your most recent lab work. I have the following recommendations:    1. Your CBC which looks at your white blood cells, red blood cells, and hemoglobin came back looking normal. No sign of infection or anemia.     2. Your metabolic panel which looks at your blood glucose, liver function, and kidney function looks perfect.     3. Your cholesterol is a little elevated. Your LDL or "bad cholesterol" is a little high. I urge you to work on making some diet and lifestyle changes to improve this. The BEST way to lower cholesterol is to follow a strict diet that is low fat combined with regular exercise. Here are a few tips on how to do this:  - Avoid foods that are high in saturated fats (especially fried foods)  - Replace butter with margarine  - Eat lots of fresh fruits and vegetables  - Choose fish, chicken, and [place] as your serving of meat  - Try to avoid too many processed foods  - Choose non fat milk  - Use whole wheat bread  You should also try and do 30 minutes of aerobic exercise most days of the week. All of these will contribute to lowering your cholesterol and decrease your risk of heart disease.     4. Your TSH which screens for thyroid disease came back normal. This means you do not have hyper or hypothyroidism.     5. Your hemoglobin a1c came back normal. This is a test that measures your average blood sugar over the last 3 months and is used to screen for prediabetes or early type 2 diabetes.     6. Your vitamin D level came back normal showing that you are not Vitamin D deficient and do not require supplementation.     Lets recheck these labs in 1 year. Please do not hesitate to call me or schedule an appointment to be seen if you need anything else in the meantime :)    Johnna AcostaJulie Nicholas Trompeter, FNP-C

## 2018-07-19 NOTE — Patient Instructions (Addendum)
Palpitations: Care Instructions  Your Care Instructions    Heart palpitations are the uncomfortable sensation that your heart is beating fast or irregularly. You might feel pounding or fluttering in your chest. It might feel like your heart is skipping a beat.  Although palpitations may be caused by a heart problem, they also occur because of stress, fatigue, or use of alcohol, caffeine, or nicotine. Many medicines, including diet pills, antihistamines, decongestants, and some herbal products, can cause heart palpitations. Nearly everyone has palpitations from time to time.  Depending on your symptoms, your doctor may need to do more tests to try to find the cause of your palpitations.  Follow-up care is a key part of your treatment and safety. Be sure to make and go to all appointments, and call your doctor if you are having problems. It's also a good idea to know your test results and keep a list of the medicines you take.  How can you care for yourself at home?  ?? Avoid caffeine, nicotine, and excess alcohol.  ?? Do not take illegal drugs, such as methamphetamines and cocaine.  ?? Do not take weight loss or diet medicines unless you talk with your doctor first.  ?? Get plenty of sleep.  ?? Do not overeat.  ?? If you have palpitations again, take deep breaths and try to relax. This may slow a racing heart.  ?? If you start to feel lightheaded, lie down to avoid injuries that might result if you pass out and fall down.  ?? Keep a record of your palpitations and bring it to your next doctor's appointment. Write down:  ? The date and time.  ? Your pulse. (If your heart is beating fast, it may be hard to count your pulse.)  ? What you were doing when the palpitations started.  ? How long the palpitations lasted.  ? Any other symptoms.  ?? If an activity causes palpitations, slow down or stop. Talk to your doctor before you do that activity again.  ?? Take your medicines exactly as prescribed. Call your doctor if you think  you are having a problem with your medicine.  When should you call for help?  Call 911 anytime you think you may need emergency care. For example, call if:  ?? ?? You passed out (lost consciousness).   ?? ?? You have symptoms of a heart attack. These may include:  ? Chest pain or pressure, or a strange feeling in the chest.  ? Sweating.  ? Shortness of breath.  ? Pain, pressure, or a strange feeling in the back, neck, jaw, or upper belly or in one or both shoulders or arms.  ? Lightheadedness or sudden weakness.  ? A fast or irregular heartbeat.  After you call 911, the operator may tell you to chew 1 adult-strength or 2 to 4 low-dose aspirin. Wait for an ambulance. Do not try to drive yourself.   ?? ?? You have symptoms of a stroke. These may include:  ? Sudden numbness, tingling, weakness, or loss of movement in your face, arm, or leg, especially on only one side of your body.  ? Sudden vision changes.  ? Sudden trouble speaking.  ? Sudden confusion or trouble understanding simple statements.  ? Sudden problems with walking or balance.  ? A sudden, severe headache that is different from past headaches.   ??Call your doctor now or seek immediate medical care if:  ?? ?? You have heart palpitations and:  ? Are   dizzy or lightheaded, or you feel like you may faint.  ? Have new or increased shortness of breath.   ??Watch closely for changes in your health, and be sure to contact your doctor if:  ?? ?? You continue to have heart palpitations.   Where can you learn more?  Go to [email or web address]://www.healthwise.net/[employer]Help[employer]ctions.  Enter R508 in the search box to learn more about "Palpitations: Care Instructions."  Current as of: May 27, 2017  Content Version: 12.1  ?? 2006-2019 [employer]. Care instructions adapted under license by [employer] (which disclaims liability or warranty for this information). If you have questions about a medical condition or  this instruction, always ask your healthcare professional. [employer], [employer] disclaims any warranty or liability for your use of this information.    Linaclotide (By mouth)   Linaclotide (lin-AK-loe-tide)  Treats irritable bowel syndrome with constipation and chronic idiopathic constipation.   Brand Name(s): Linzess   There may be other brand names for this medicine.  When This Medicine Should Not Be Used:   This medicine is not right for everyone. Do not use it if you had an allergic reaction to linaclotide, or if you have a bowel or stomach blockage.  How to Use This Medicine:   Capsule  ?? Your doctor will tell you how much medicine to use. Do not use more than directed.  ?? Take this medicine on an empty stomach, at least 30 minutes before breakfast or your first meal of the day.  ?? Swallow the capsule whole. Do not crush, break, or chew it.  ?? If you have trouble swallowing the capsule, you may mix the contents with applesauce or water.  ?? To mix with applesauce: Open the capsule and sprinkle the beads on 1 teaspoonful of applesauce. Swallow the mixture immediately without chewing. Do not store it for later use.  ?? To mix with water: Open the capsule and sprinkle the beads into a cup with 30 mL (1 ounce) of water. Gently swirl for at least 10 seconds. Swallow the entire mixture immediately. Add another 30 mL (1 ounce) of water to the cup and swirl. Drink the water right away to make sure all of the medicine is taken. Do not store it for later use.  ?? The water mixture may also be used with a nasogastric or gastric feeding tube. After the mixture is given, flush the tube with an additional 10 mL (2 teaspoons) of water.  ?? This medicine should come with a Medication Guide. Ask your pharmacist for a copy if you do not have one.  ?? Missed dose: Skip the missed dose and go back to your regular dosing schedule. Do not double doses.  ?? Store the medicine in a closed container at room temperature, away from  heat, moisture, and direct light. Keep the medicine in the original bottle until you are ready to use it. The bottle contains a desiccant packet that helps protect the capsules from moisture. Do not remove the packet from the bottle.  Drugs and Foods to Avoid:      Ask your doctor or pharmacist before using any other medicine, including over-the-counter medicines, vitamins, and herbal products.      Warnings While Using This Medicine:   ?? Tell your doctor if you are pregnant or breastfeeding, or if you have other stomach or bowel problems.  ?? Your doctor will check your progress and the effects of this medicine at regular visits. Keep all appointments.  ?? Keep  all medicine out of the reach of children. Never share your medicine with anyone.  Possible Side Effects While Using This Medicine:   Call your doctor right away if you notice any of these side effects:  ?? Allergic reaction: Itching or hives, swelling in your face or hands, swelling or tingling in your mouth or throat, chest tightness, trouble breathing  ?? Bloody or black, tarry stools  ?? Lightheadedness, dizziness, fainting  ?? Severe diarrhea or stomach pain  If you notice these less serious side effects, talk with your doctor:   ?? Mild diarrhea  If you notice other side effects that you think are caused by this medicine, tell your doctor.   Call your doctor for medical advice about side effects. You may report side effects to FDA at 1-800-FDA-1088  ?? 2017 [facility] Information is for End User's use only and may not be sold, redistributed or otherwise used for commercial purposes.  The above information is an educational aid only. It is not intended as medical advice for individual conditions or treatments. Talk to your doctor, nurse or pharmacist before following any medical regimen to see if it is safe and effective for you.

## 2018-07-19 NOTE — Progress Notes (Signed)
The following message was sent to pt via mychart portal in reference to lab results:    Good evening Ms. Paz,     Attached are the results of your most recent lab work. I have the following recommendations:    1. Your CBC which looks at your white blood cells, red blood cells, and hemoglobin came back looking normal. No sign of infection or anemia.     2. Your metabolic panel which looks at your blood glucose, liver function, and kidney function looks perfect.     3. Your cholesterol is a little elevated. Your LDL or "bad cholesterol" is a little high. I urge you to work on making some diet and lifestyle changes to improve this. The BEST way to lower cholesterol is to follow a strict diet that is low fat combined with regular exercise. Here are a few tips on how to do this:  - Avoid foods that are high in saturated fats (especially fried foods)  - Replace butter with margarine  - Eat lots of fresh fruits and vegetables  - Choose fish, chicken, and turkey as your serving of meat  - Try to avoid too many processed foods  - Choose non fat milk  - Use whole wheat bread  You should also try and do 30 minutes of aerobic exercise most days of the week. All of these will contribute to lowering your cholesterol and decrease your risk of heart disease.     4. Your TSH which screens for thyroid disease came back normal. This means you do not have hyper or hypothyroidism.     5. Your hemoglobin a1c came back normal. This is a test that measures your average blood sugar over the last 3 months and is used to screen for prediabetes or early type 2 diabetes.     6. Your vitamin D level came back normal showing that you are not Vitamin D deficient and do not require supplementation.     Lets recheck these labs in 1 year. Please do not hesitate to call me or schedule an appointment to be seen if you need anything else in the meantime :)    Sharnee Douglass, FNP-C

## 2018-07-19 NOTE — Progress Notes (Signed)
Chief Complaint   Patient presents with   ??? Physical   ??? Labs     Patient in office today for cpe and labs; last meal @ 7am.  Pt receives gyn care and mammogram with Dr.Howell @ [facility]. Last done in July.     Have c/o of constipation that was noted 3 wks ago.  Pt states she has treated with Citrucel and enema, with relief noted but felt like not as much came out that she expected.  Pt states last night, did eat salad last night and noted a VERY large BM.  Pt states abdomen is tender today.  Pt has been treating with probiotics.    Has previously had colonoscopies and endoscopies.   Has struggled with constipation for many years.     Denies any cp, sob, and dyspnea. Had heart palpitations recently. Husband lost his job in August which has been stressful. Usually this occurs during more stressful situations. Xanax helped resolve those sx last time.   Denies any ha or dizziness.   Denies any n/v. Just bloating and pain in the abdomen with acute constipation.   Denies any urinary sx.   Denies any ankle swelling.     Denies any other concerns at this time.     Chief Complaint   Patient presents with   ??? Physical   ??? Labs     she is a 47 y.o. year old female who presents for evalution.    Reviewed PmHx, RxHx, FmHx, SocHx, AllgHx and updated and dated in the chart.    Review of Systems - negative except as listed above in the HPI    Objective:     Vitals:    07/19/18 1528   BP: 103/65   Pulse: 67   Resp: 18   Temp: 97.9 ??F (36.6 ??C)   TempSrc: Oral   SpO2: 100%   Weight: 130 lb (59 kg)   Height: 5\' 8"  (1.727 m)     Physical Examination: General appearance - alert, well appearing, and in no distress  Mental status - normal mood, behavior, speech, dress, motor activity, and thought processes  Eyes - pupils equal and reactive, extraocular eye movements intact  Ears - bilateral TM's and external ear canals normal  Nose - normal and patent, no erythema, discharge or polyps and normal nontender sinuses   Mouth - mucous membranes moist, pharynx normal without lesions  Neck - supple, no significant adenopathy, carotids upstroke normal bilaterally, no bruits, thyroid exam: thyroid is normal in size without nodules or tenderness  Chest - clear to auscultation, no wheezes, rales or rhonchi, symmetric air entry  Heart - normal rate, regular rhythm, normal S1, S2, no murmurs  Abdomen - soft, generalized mild tenderness to palpation, nondistended, no masses or organomegaly  bowel sounds normal  Extremities - peripheral pulses normal, no edema, no clubbing or cyanosis  Skin - normal coloration and turgor, no rashes, no suspicious skin lesions noted    Assessment/ Plan:   Diagnoses and all orders for this visit:    1. Encounter for annual physical exam  -     LIPID PANEL  -     METABOLIC PANEL, COMPREHENSIVE  -     CBC WITH AUTOMATED DIFF  -     VITAMIN D, 25 HYDROXY  -     HEMOGLOBIN A1C WITH EAG  Healthy appearing 47 year old female. Will notify results and deviate plan based on findings.   2. Screening for thyroid  disorder  -     TSH 3RD GENERATION  Screening, asx.   3. Chronic idiopathic constipation  -     linaCLOtide (LINZESS) 72 mcg cap capsule; Take 1 Cap by mouth Daily (before breakfast).  Start linzess daily. Reviewed SEs/ADRs of medication. Continue other supportive measures.   4. Palpitations  Enc pt to keep a diary of sx and follow up for further evaluation if the episodes increase in frequency, severity or duration.      Follow-up and Dispositions    ?? Return if symptoms worsen or fail to improve.         I have discussed the diagnosis with the patient and the intended plan as seen in the above orders.  The patient has received an after-visit summary and questions were answered concerning future plans.     Medication Side Effects and Warnings were discussed with patient: yes  Patient Labs were reviewed and or requested: yes  Patient Past Records were reviewed and or requested  yes   Patient / Caregiver Understanding of treatment plan was verbalized during office visit YES    Johnna AcostaJulie Baudelio Karnes, FNP-C    Patient Instructions          Palpitations: Care Instructions  Your Care Instructions    Heart palpitations are the uncomfortable sensation that your heart is beating fast or irregularly. You might feel pounding or fluttering in your chest. It might feel like your heart is skipping a beat.  Although palpitations may be caused by a heart problem, they also occur because of stress, fatigue, or use of alcohol, caffeine, or nicotine. Many medicines, including diet pills, antihistamines, decongestants, and some herbal products, can cause heart palpitations. Nearly everyone has palpitations from time to time.  Depending on your symptoms, your doctor may need to do more tests to try to find the cause of your palpitations.  Follow-up care is a key part of your treatment and safety. Be sure to make and go to all appointments, and call your doctor if you are having problems. It's also a good idea to know your test results and keep a list of the medicines you take.  How can you care for yourself at home?  ?? Avoid caffeine, nicotine, and excess alcohol.  ?? Do not take illegal drugs, such as methamphetamines and cocaine.  ?? Do not take weight loss or diet medicines unless you talk with your doctor first.  ?? Get plenty of sleep.  ?? Do not overeat.  ?? If you have palpitations again, take deep breaths and try to relax. This may slow a racing heart.  ?? If you start to feel lightheaded, lie down to avoid injuries that might result if you pass out and fall down.  ?? Keep a record of your palpitations and bring it to your next doctor's appointment. Write down:  ? The date and time.  ? Your pulse. (If your heart is beating fast, it may be hard to count your pulse.)  ? What you were doing when the palpitations started.  ? How long the palpitations lasted.  ? Any other symptoms.   ?? If an activity causes palpitations, slow down or stop. Talk to your doctor before you do that activity again.  ?? Take your medicines exactly as prescribed. Call your doctor if you think you are having a problem with your medicine.  When should you call for help?  Call 911 anytime you think you may need emergency care. For example, call if:  ?? ??  You passed out (lost consciousness).   ?? ?? You have symptoms of a heart attack. These may include:  ? Chest pain or pressure, or a strange feeling in the chest.  ? Sweating.  ? Shortness of breath.  ? Pain, pressure, or a strange feeling in the back, neck, jaw, or upper belly or in one or both shoulders or arms.  ? Lightheadedness or sudden weakness.  ? A fast or irregular heartbeat.  After you call 911, the operator may tell you to chew 1 adult-strength or 2 to 4 low-dose aspirin. Wait for an ambulance. Do not try to drive yourself.   ?? ?? You have symptoms of a stroke. These may include:  ? Sudden numbness, tingling, weakness, or loss of movement in your face, arm, or leg, especially on only one side of your body.  ? Sudden vision changes.  ? Sudden trouble speaking.  ? Sudden confusion or trouble understanding simple statements.  ? Sudden problems with walking or balance.  ? A sudden, severe headache that is different from past headaches.   ??Call your doctor now or seek immediate medical care if:  ?? ?? You have heart palpitations and:  ? Are dizzy or lightheaded, or you feel like you may faint.  ? Have new or increased shortness of breath.   ??Watch closely for changes in your health, and be sure to contact your doctor if:  ?? ?? You continue to have heart palpitations.   Where can you learn more?  Go to InsuranceStats.ca.  Enter R508 in the search box to learn more about "Palpitations: Care Instructions."  Current as of: May 27, 2017  Content Version: 12.1  ?? 2006-2019 [employer], [employer]. Care instructions adapted under  license by [employer] (which disclaims liability or warranty for this information). If you have questions about a medical condition or this instruction, always ask your healthcare professional. [employer] disclaims any warranty or liability for your use of this information.    Linaclotide (By mouth)   Linaclotide (lin-AK-loe-tide)  Treats irritable bowel syndrome with constipation and chronic idiopathic constipation.   Brand Name(s): Linzess   There may be other brand names for this medicine.  When This Medicine Should Not Be Used:   This medicine is not right for everyone. Do not use it if you had an allergic reaction to linaclotide, or if you have a bowel or stomach blockage.  How to Use This Medicine:   Capsule  ?? Your doctor will tell you how much medicine to use. Do not use more than directed.  ?? Take this medicine on an empty stomach, at least 30 minutes before breakfast or your first meal of the day.  ?? Swallow the capsule whole. Do not crush, break, or chew it.  ?? If you have trouble swallowing the capsule, you may mix the contents with applesauce or water.  ?? To mix with applesauce: Open the capsule and sprinkle the beads on 1 teaspoonful of applesauce. Swallow the mixture immediately without chewing. Do not store it for later use.  ?? To mix with water: Open the capsule and sprinkle the beads into a cup with 30 mL (1 ounce) of water. Gently swirl for at least 10 seconds. Swallow the entire mixture immediately. Add another 30 mL (1 ounce) of water to the cup and swirl. Drink the water right away to make sure all of the medicine is taken. Do not store it for later use.  ?? The water mixture may  also be used with a nasogastric or gastric feeding tube. After the mixture is given, flush the tube with an additional 10 mL (2 teaspoons) of water.  ?? This medicine should come with a Medication Guide. Ask your pharmacist for a copy if you do not have one.   ?? Missed dose: Skip the missed dose and go back to your regular dosing schedule. Do not double doses.  ?? Store the medicine in a closed container at room temperature, away from heat, moisture, and direct light. Keep the medicine in the original bottle until you are ready to use it. The bottle contains a desiccant packet that helps protect the capsules from moisture. Do not remove the packet from the bottle.  Drugs and Foods to Avoid:      Ask your doctor or pharmacist before using any other medicine, including over-the-counter medicines, vitamins, and herbal products.      Warnings While Using This Medicine:   ?? Tell your doctor if you are pregnant or breastfeeding, or if you have other stomach or bowel problems.  ?? Your doctor will check your progress and the effects of this medicine at regular visits. Keep all appointments.  ?? Keep all medicine out of the reach of children. Never share your medicine with anyone.  Possible Side Effects While Using This Medicine:   Call your doctor right away if you notice any of these side effects:  ?? Allergic reaction: Itching or hives, swelling in your face or hands, swelling or tingling in your mouth or throat, chest tightness, trouble breathing  ?? Bloody or black, tarry stools  ?? Lightheadedness, dizziness, fainting  ?? Severe diarrhea or stomach pain  If you notice these less serious side effects, talk with your doctor:   ?? Mild diarrhea  If you notice other side effects that you think are caused by this medicine, tell your doctor.   Call your doctor for medical advice about side effects. You may report side effects to FDA at 1-800-FDA-1088  ?? 2017 [facility] Information is for End User's use only and may not be sold, redistributed or otherwise used for commercial purposes.  The above information is an educational aid only. It is not intended as medical advice for individual conditions or treatments. Talk to your  doctor, nurse or pharmacist before following any medical regimen to see if it is safe and effective for you.

## 2018-07-19 NOTE — Progress Notes (Signed)
Chief Complaint   Patient presents with   ??? Physical   ??? Labs     Patient in office today for cpe and labs;last meal @ 7am.  Pt receives gyn care and mammogram with Dr.Howell @ [facility].    Have c/o of constipation that was noted 3 wks ago.  Pt states she has treated with Citrucel and enema,with an relief noted.  Pt states last night,did eat salad last night and noted large bm.  Pt states abdomen is tender today.  Pt has been treating with probiotics.      1. Have you been to the ER, urgent care clinic since your last visit?  Hospitalized since your last visit?No    2. Have you seen or consulted any other health care providers outside of the [facility] since your last visit?  Include any pap smears or colon screening. No

## 2018-07-20 LAB — COMPREHENSIVE METABOLIC PANEL
ALT: 13 IU/L (ref 0–32)
AST: 17 IU/L (ref 0–40)
Albumin/Globulin Ratio: 1.5 NA (ref 1.2–2.2)
Albumin: 3.9 g/dL (ref 3.5–5.5)
Alkaline Phosphatase: 35 IU/L — ABNORMAL LOW (ref 39–117)
BUN: 8 mg/dL (ref 6–24)
Bun/Cre Ratio: 10 NA (ref 9–23)
CO2: 24 mmol/L (ref 20–29)
Calcium: 9.2 mg/dL (ref 8.7–10.2)
Chloride: 103 mmol/L (ref 96–106)
Creatinine: 0.81 mg/dL (ref 0.57–1.00)
EGFR IF NonAfrican American: 86 mL/min/{1.73_m2} (ref >59)
GFR African American: 99 mL/min/{1.73_m2} (ref >59)
Globulin, Total: 2.6 g/dL (ref 1.5–4.5)
Glucose: 82 mg/dL (ref 65–99)
Potassium: 4 mmol/L (ref 3.5–5.2)
Sodium: 139 mmol/L (ref 134–144)
Total Bilirubin: 0.5 mg/dL (ref 0.0–1.2)
Total Protein: 6.5 g/dL (ref 6.0–8.5)

## 2018-07-20 LAB — LIPID PANEL
Cholesterol, Total: 201 mg/dL — ABNORMAL HIGH (ref 100–199)
Cholesterol, total: 201 mg/dL — ABNORMAL HIGH (ref 100–199)
HDL Cholesterol: 70 mg/dL (ref >39)
HDL: 70 mg/dL (ref >39)
LDL Calculated: 113 mg/dL — ABNORMAL HIGH (ref 0–99)
LDL, calculated: 113 mg/dL — ABNORMAL HIGH (ref 0–99)
Triglyceride: 88 mg/dL (ref 0–149)
Triglycerides: 88 mg/dL (ref 0–149)
VLDL Cholesterol Calculated: 18 mg/dL (ref 5–40)
VLDL, calculated: 18 mg/dL (ref 5–40)

## 2018-07-20 LAB — CBC WITH AUTO DIFFERENTIAL
Basophils %: 1 %
Basophils Absolute: 0.1 10*3/uL (ref 0.0–0.2)
Eosinophils %: 5 %
Eosinophils Absolute: 0.3 10*3/uL (ref 0.0–0.4)
Granulocyte Absolute Count: 0 10*3/uL (ref 0.0–0.1)
Hematocrit: 36.8 % (ref 34.0–46.6)
Hemoglobin: 12.4 g/dL (ref 11.1–15.9)
Immature Granulocytes: 0 %
Lymphocytes %: 35 %
Lymphocytes Absolute: 2.4 10*3/uL (ref 0.7–3.1)
MCH: 31.9 pg (ref 26.6–33.0)
MCHC: 33.7 g/dL (ref 31.5–35.7)
MCV: 95 fL (ref 79–97)
Monocytes %: 7 %
Monocytes Absolute: 0.5 10*3/uL (ref 0.1–0.9)
Neutrophils %: 52 %
Neutrophils Absolute: 3.5 10*3/uL (ref 1.4–7.0)
Platelets: 227 10*3/uL (ref 150–450)
RBC: 3.89 x10E6/uL (ref 3.77–5.28)
RDW: 12 % — ABNORMAL LOW (ref 12.3–15.4)
WBC: 6.7 10*3/uL (ref 3.4–10.8)

## 2018-07-20 LAB — HEMOGLOBIN A1C W/EAG
Hemoglobin A1C: 5.2 % (ref 4.8–5.6)
eAG: 103 mg/dL

## 2018-07-20 LAB — CVD REPORT

## 2018-07-20 LAB — TSH 3RD GENERATION
TSH: 1.54 u[IU]/mL (ref 0.450–4.500)
TSH: 1.54 u[IU]/mL (ref 0.450–4.500)

## 2018-07-20 LAB — VITAMIN D 25 HYDROXY: Vit D, 25-Hydroxy: 59 ng/mL (ref 30.0–100.0)

## 2018-07-20 LAB — CBC WITH AUTOMATED DIFF
ABS. BASOPHILS: 0.1 10*3/uL (ref 0.0–0.2)
ABS. EOSINOPHILS: 0.3 10*3/uL (ref 0.0–0.4)
ABS. IMM. GRANS.: 0 10*3/uL (ref 0.0–0.1)
ABS. MONOCYTES: 0.5 10*3/uL (ref 0.1–0.9)
ABS. NEUTROPHILS: 3.5 10*3/uL (ref 1.4–7.0)
Abs Lymphocytes: 2.4 10*3/uL (ref 0.7–3.1)
BASOPHILS: 1 %
EOSINOPHILS: 5 %
HCT: 36.8 % (ref 34.0–46.6)
HGB: 12.4 g/dL (ref 11.1–15.9)
IMMATURE GRANULOCYTES: 0 %
Lymphocytes: 35 %
MCH: 31.9 pg (ref 26.6–33.0)
MCHC: 33.7 g/dL (ref 31.5–35.7)
MCV: 95 fL (ref 79–97)
MONOCYTES: 7 %
NEUTROPHILS: 52 %
PLATELET: 227 10*3/uL (ref 150–450)
RBC: 3.89 x10E6/uL (ref 3.77–5.28)
RDW: 12 % — ABNORMAL LOW (ref 12.3–15.4)
WBC: 6.7 10*3/uL (ref 3.4–10.8)

## 2018-07-20 LAB — METABOLIC PANEL, COMPREHENSIVE
A-G Ratio: 1.5 (ref 1.2–2.2)
ALT (SGPT): 13 IU/L (ref 0–32)
AST (SGOT): 17 IU/L (ref 0–40)
Albumin: 3.9 g/dL (ref 3.5–5.5)
Alk. phosphatase: 35 IU/L — ABNORMAL LOW (ref 39–117)
BUN/Creatinine ratio: 10 (ref 9–23)
BUN: 8 mg/dL (ref 6–24)
Bilirubin, total: 0.5 mg/dL (ref 0.0–1.2)
CO2: 24 mmol/L (ref 20–29)
Calcium: 9.2 mg/dL (ref 8.7–10.2)
Chloride: 103 mmol/L (ref 96–106)
Creatinine: 0.81 mg/dL (ref 0.57–1.00)
GFR est AA: 99 mL/min/{1.73_m2} (ref >59)
GFR est non-AA: 86 mL/min/{1.73_m2} (ref >59)
GLOBULIN, TOTAL: 2.6 g/dL (ref 1.5–4.5)
Glucose: 82 mg/dL (ref 65–99)
Potassium: 4 mmol/L (ref 3.5–5.2)
Protein, total: 6.5 g/dL (ref 6.0–8.5)
Sodium: 139 mmol/L (ref 134–144)

## 2018-07-20 LAB — VITAMIN D, 25 HYDROXY: VITAMIN D, 25-HYDROXY: 59 ng/mL (ref 30.0–100.0)

## 2018-07-20 LAB — HEMOGLOBIN A1C WITH EAG
Estimated average glucose: 103 mg/dL
Hemoglobin A1c: 5.2 % (ref 4.8–5.6)

## 2018-07-22 ENCOUNTER — Encounter

## 2018-07-22 MED ORDER — LINACLOTIDE 72 MCG CAPSULE
72 mcg | ORAL_CAPSULE | Freq: Every day | ORAL | 1 refills | Status: DC
Start: 2018-07-22 — End: 2018-08-01

## 2018-08-01 ENCOUNTER — Encounter

## 2018-08-01 MED ORDER — PLECANATIDE 3 MG TABLET
3 mg | ORAL_TABLET | Freq: Every day | ORAL | 1 refills | Status: AC
Start: 2018-08-01 — End: ?

## 2018-08-01 NOTE — Telephone Encounter (Signed)
Please notify that alternative to Linzess (since not covered) called Trulance sent to pharmacy on file to take once daily for constipation.

## 2018-08-01 NOTE — Telephone Encounter (Signed)
Pt has been advised via vm.

## 2018-08-09 NOTE — Progress Notes (Signed)
PA has been submitted for Trulance 3mg  on 08/09/2018.  Via [email or web address]  PA Case ID: [id number]   Rx #: [id number]  PA has been approved; pharmacy has been notified.

## 2018-08-09 NOTE — Progress Notes (Signed)
PA has been submitted for Trulance 3mg on 08/09/2018.  Via [email or web address]  PA Case ID: [id number]   Rx #: [id number]  PA has been approved; pharmacy has been notified.

## 2018-10-14 MED ORDER — AZITHROMYCIN 250 MG TAB
250 mg | ORAL_TABLET | ORAL | 0 refills | Status: AC
Start: 2018-10-14 — End: 2018-10-18

## 2018-10-25 MED ORDER — CEFDINIR 300 MG CAP
300 mg | ORAL_CAPSULE | Freq: Two times a day (BID) | ORAL | 0 refills | Status: AC
Start: 2018-10-25 — End: 2018-11-04

## 2018-10-25 NOTE — Telephone Encounter (Signed)
I am sending this to provider for advise for patient.

## 2019-01-08 ENCOUNTER — Encounter

## 2019-01-08 MED ORDER — DULOXETINE 60 MG CAP, DELAYED RELEASE
60 mg | ORAL_CAPSULE | ORAL | 3 refills | Status: DC
Start: 2019-01-08 — End: 2019-03-20

## 2019-02-18 NOTE — Telephone Encounter (Addendum)
Returned call and lmvm to call office back about making appt.     Pt returned call and virtual appt made for 4/15.

## 2019-02-18 NOTE — Telephone Encounter (Signed)
-----   Message from Marcy Salvo sent at 02/18/2019  2:19 PM EDT -----  Regarding: [place]/telephone  Pt is requesting an appointment for left elbow pain.Pts number is [phone].She does have wifi,video capability and my chart.

## 2019-02-19 ENCOUNTER — Telehealth
Admit: 2019-02-19 | Discharge: 2019-02-19 | Payer: PRIVATE HEALTH INSURANCE | Attending: Adult Health | Primary: Adult Health

## 2019-02-19 ENCOUNTER — Telehealth: Attending: Adult Health | Primary: Adult Health

## 2019-02-19 DIAGNOSIS — M7712 Lateral epicondylitis, left elbow: Secondary | ICD-10-CM

## 2019-02-19 MED ORDER — PREDNISONE 10 MG TABLETS IN A DOSE PACK
10 mg | ORAL_TABLET | ORAL | 0 refills | Status: AC
Start: 2019-02-19 — End: ?

## 2019-02-19 NOTE — Progress Notes (Signed)
Consent: Leslie Harrell, who was seen by synchronous (real-time) audio-video technology, and/or her healthcare decision maker, is aware that this patient-initiated, Telehealth encounter on 02/19/2019 is a billable service, with coverage as determined by her insurance carrier. She is aware that she may receive a bill and has provided verbal consent to proceed: Yes.      I spent at least 15 minutes with this established patient, and >50% of the time was spent counseling and/or coordinating care regarding left elbow pain.  712  Subjective:   Leslie Harrell is a 48 y.o. female who was seen for Elbow Pain  patient is having left tennis elbow pain, hurts right on the bone with burning sensation. 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  Tried ice and icy hot but not helping  Using tendonitis strap intermittently  Radiates into upper arm as well    Prior to Admission medications    Medication Sig Start Date End Date Taking? Authorizing Provider   predniSONE (STERAPRED DS) 10 mg dose pack See administration instruction per 10mg  dose pack - 6 days 02/19/19  Yes Loleta Rose, NP   DULoxetine (CYMBALTA) 60 mg capsule TAKE 1 CAPSULE BY MOUTH EVERY DAY 01/08/19   Loleta Rose, NP   plecanatide (TRULANCE) 3 mg tab Take 1 Tab by mouth daily. 08/01/18   Lynford Humphrey, NP   drospirenone-ethinyl estradiol (YASMIN) 3-0.03 mg tab TAKE 1 TABLET BY MOUTH EVERY DAY 05/28/18   Jamal Collin, MD   traZODone (DESYREL) 100 mg tablet Take 1 Tab by mouth nightly. 03/29/18   Loleta Rose, NP   loratadine (CLARITIN) 10 mg tablet TAKE 1 TABLET BY MOUTH EVERY DAY 02/18/18   Ellison Hughs, NP   ALPRAZolam Prudy Feeler) 0.5 mg tablet Take 1 Tab by mouth two (2) times daily as needed for Anxiety. Max Daily Amount: 1 mg. 11/26/17   Loleta Rose, NP   esomeprazole (NEXIUM) 20 mg capsule Take  by mouth daily.    Provider, Historical     Allergies   Allergen Reactions   ??? Codeine Other (comments)     "Bad  headaches"       Patient Active Problem List    Diagnosis Date Noted   ??? 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 SUI (stress urinary incontinence, female) 12/28/2011       ROS      Objective:   There were no vitals taken for this visit.   General: alert, cooperative, no distress   Mental  status: normal mood, behavior, speech, dress, motor activity, and thought processes, able to follow commands   HENT: NCAT   Neck: no visualized mass   Resp: no respiratory distress   Neuro: no gross deficits   Skin: no discoloration or lesions of concern on visible areas   Psychiatric: normal affect, consistent with stated mood, no evidence of hallucinations     Additional exam findings: none  Diagnoses and all orders for this visit:    1. Lateral epicondylitis of left elbow    Other orders  -     predniSONE (STERAPRED DS) 10 mg dose pack; See administration instruction per 10mg  dose pack - 6 days      Given pred pack, reviewed how to take and what to expect  May need injection post quarantine if not improving  Cont to use tendonitis strap      We discussed the expected course, resolution and complications of the diagnosis(es) in  detail.  Medication risks, benefits, costs, interactions, and alternatives were discussed as indicated.  I advised her to contact the office if her condition worsens, changes or fails to improve as anticipated. She expressed understanding with the diagnosis(es) and plan.       Leslie Harrell is a 48 y.o. female being evaluated by a video visit encounter for concerns as above.  A caregiver was present when appropriate. Due to this being a Scientist, research (medical)TeleHealth encounter (During COVID-19 public health emergency), evaluation of the following organ systems was limited: Vitals/Constitutional/EENT/Resp/CV/GI/GU/MS/Neuro/Skin/Heme-Lymph-Imm.  Pursuant to the emergency declaration under the [facility] Act and the [employer] Emergencies Act, 1135 waiver authority and the [employer] and [employer] Supplemental Appropriations  Act, this Virtual  Visit was conducted, with patient's (and/or legal guardian's) consent, to reduce the patient's risk of exposure to COVID-19 and provide necessary medical care.     Services were provided through a video synchronous discussion virtually to substitute for in-person clinic visit.   Patient and provider were located at their individual homes.        Loleta RoseMelissa N Jadis Pitter, NP

## 2019-02-19 NOTE — Progress Notes (Signed)
Consent: Leslie DownsStacy Figge, who was seen by synchronous (real-time) audio-video technology, and/or her healthcare decision maker, is aware that this patient-initiated, Telehealth encounter on 02/19/2019 is a billable service, with coverage as determined by her insurance carrier. She is aware that she may receive a bill and has provided verbal consent to proceed: Yes.      I spent at least 15 minutes with this established patient, and >50% of the time was spent counseling and/or coordinating care regarding left elbow pain.  712  Subjective:   Leslie Harrell is a 48 y.o. female who was seen for Elbow Pain  patient is having left tennis elbow pain, hurts right on the bone with burning sensation. 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  Tried ice and icy hot but not helping  Using tendonitis strap intermittently  Radiates into upper arm as well    Prior to Admission medications    Medication Sig Start Date End Date Taking? Authorizing Provider   predniSONE (STERAPRED DS) 10 mg dose pack See administration instruction per 10mg  dose pack - 6 days 02/19/19  Yes Loleta Roselayton, Dyllan Hughett N, NP   DULoxetine (CYMBALTA) 60 mg capsule TAKE 1 CAPSULE BY MOUTH EVERY DAY 01/08/19   Loleta Roselayton, Yarelis Ambrosino N, NP   plecanatide (TRULANCE) 3 mg tab Take 1 Tab by mouth daily. 08/01/18   Lynford HumphreyGoodman, Julie A, NP   drospirenone-ethinyl estradiol (YASMIN) 3-0.03 mg tab TAKE 1 TABLET BY MOUTH EVERY DAY 05/28/18   Jamal CollinHowell, Malcolm, MD   traZODone (DESYREL) 100 mg tablet Take 1 Tab by mouth nightly. 03/29/18   Loleta Roselayton, Jaymen Fetch N, NP   loratadine (CLARITIN) 10 mg tablet TAKE 1 TABLET BY MOUTH EVERY DAY 02/18/18   Ellison HughsGarris, Stephanie H, NP   ALPRAZolam Prudy Feeler(XANAX) 0.5 mg tablet Take 1 Tab by mouth two (2) times daily as needed for Anxiety. Max Daily Amount: 1 mg. 11/26/17   Loleta Roselayton, Jaivion Kingsley N, NP   esomeprazole (NEXIUM) 20 mg capsule Take  by mouth daily.    Provider, Historical     Allergies   Allergen Reactions   ??? Codeine Other (comments)      "Bad headaches"       Patient Active Problem List    Diagnosis Date Noted   ??? 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 SUI (stress urinary incontinence, female) 12/28/2011       ROS      Objective:   There were no vitals taken for this visit.   General: alert, cooperative, no distress   Mental  status: normal mood, behavior, speech, dress, motor activity, and thought processes, able to follow commands   HENT: NCAT   Neck: no visualized mass   Resp: no respiratory distress   Neuro: no gross deficits   Skin: no discoloration or lesions of concern on visible areas   Psychiatric: normal affect, consistent with stated mood, no evidence of hallucinations     Additional exam findings: none  Diagnoses and all orders for this visit:    1. Lateral epicondylitis of left elbow    Other orders  -     predniSONE (STERAPRED DS) 10 mg dose pack; See administration instruction per 10mg  dose pack - 6 days      Given pred pack, reviewed how to take and what to expect  May need injection post quarantine if not improving  Cont to use tendonitis strap      We discussed the expected course, resolution and complications of the diagnosis(es) in  detail.  Medication risks, benefits, costs, interactions, and alternatives were discussed as indicated.  I advised her to contact the office if her condition worsens, changes or fails to improve as anticipated. She expressed understanding with the diagnosis(es) and plan.       Leslie Harrell is a 48 y.o. female being evaluated by a video visit encounter for concerns as above.  A caregiver was present when appropriate. Due to this being a Scientist, research (medical) (During COVID-19 public health emergency), evaluation of the following organ systems was limited: Vitals/Constitutional/EENT/Resp/CV/GI/GU/MS/Neuro/Skin/Heme-Lymph-Imm.  Pursuant to the emergency declaration under the [facility] Act and the [employer], 1135 waiver authority and the [employer] Act, this Virtual  Visit was conducted, with patient's (and/or legal guardian's) consent, to reduce the patient's risk of exposure to COVID-19 and provide necessary medical care.     Services were provided through a video synchronous discussion virtually to substitute for in-person clinic visit.   Patient and provider were located at their individual homes.        Loleta Rose, NP

## 2019-03-20 ENCOUNTER — Encounter

## 2019-03-20 MED ORDER — DULOXETINE 60 MG CAP, DELAYED RELEASE
60 mg | ORAL_CAPSULE | ORAL | 3 refills | Status: DC
Start: 2019-03-20 — End: 2019-12-15

## 2019-05-05 ENCOUNTER — Encounter

## 2019-05-19 ENCOUNTER — Encounter

## 2019-05-20 MED ORDER — ALPRAZOLAM 0.5 MG TAB
0.5 mg | ORAL_TABLET | Freq: Two times a day (BID) | ORAL | 0 refills | Status: AC | PRN
Start: 2019-05-20 — End: ?

## 2019-05-20 MED ORDER — TRAZODONE 100 MG TAB
100 mg | ORAL_TABLET | Freq: Every evening | ORAL | 5 refills | Status: AC
Start: 2019-05-20 — End: ?

## 2019-05-30 ENCOUNTER — Ambulatory Visit: Payer: BLUE CROSS/BLUE SHIELD | Primary: Adult Health

## 2019-05-30 ENCOUNTER — Encounter: Attending: Obstetrics & Gynecology | Primary: Adult Health

## 2019-06-02 ENCOUNTER — Inpatient Hospital Stay: Admit: 2019-06-02 | Payer: BLUE CROSS/BLUE SHIELD | Attending: Obstetrics & Gynecology | Primary: Adult Health

## 2019-06-02 ENCOUNTER — Ambulatory Visit
Admit: 2019-06-02 | Discharge: 2019-06-02 | Payer: PRIVATE HEALTH INSURANCE | Attending: Obstetrics & Gynecology | Primary: Adult Health

## 2019-06-02 ENCOUNTER — Ambulatory Visit: Attending: Obstetrics & Gynecology | Primary: Adult Health

## 2019-06-02 DIAGNOSIS — Z01419 Encounter for gynecological examination (general) (routine) without abnormal findings: Secondary | ICD-10-CM

## 2019-06-02 DIAGNOSIS — Z1231 Encounter for screening mammogram for malignant neoplasm of breast: Secondary | ICD-10-CM

## 2019-06-02 MED ORDER — DROSPIRENONE 3 MG-ETHINYL ESTRADIOL 0.03 MG TABLET
ORAL | 3 refills | Status: DC
Start: 2019-06-02 — End: 2019-07-24

## 2019-06-02 NOTE — Progress Notes (Signed)
Leslie Harrell is a G3 P82,  48 y.o. female [place] whose LMP was on 05/27/2019 who presents for her annual checkup. She is having no significant problems.    Menstrual status:    Her periods are moderatet in flow. She is using three to five pads or tampons per day, usually regular and last 26-30 days.    She denies dysmenorrhea.    She reports no premenstrual symptoms.    The patient is not using HRT.    Contraception:    The current method of family planning is vasectomy and OCP (estrogen/progesterone).    Sexual history:    She  reports being sexually active and has had partner(s) who are Female. She reports using the following method of birth control/protection: Pill.    Medical conditions:    Since her last annual GYN exam about one year ago, she has had the following changes in her health history: none.       Pap and Mammogram History:    Her most recent Pap smear was normal obtained 2 year(s) ago.    The patient has mammo scheduled for today downstairs at [facility].    Breast Cancer History/Substance Abuse:    She has a family history of breast cancer.      Osteoporosis History:    Family history does not include a first or second degree relative with osteopenia or osteoporosis.    A bone density scan has not been previously obtained.       Past Medical History:   Diagnosis Date   ??? Bursitis    ??? GERD (gastroesophageal reflux disease)    ??? History of shingles    ??? PMDD (premenstrual dysphoric disorder)      Past Surgical History:   Procedure Laterality Date   ??? HX APPENDECTOMY  1987   ??? HX CESAREAN SECTION  1999, 2002   ??? HX CYSTECTOMY      ovarian   ??? HX GYN      TVT   ??? HX HEMORRHOIDECTOMY     ??? HX TONSIL AND ADENOIDECTOMY  1986   ??? HX UROLOGICAL  12/2010    bladder sling     Current Outpatient Medications   Medication Sig Dispense Refill   ??? traZODone (DESYREL) 100 mg tablet Take 1 Tab by mouth nightly. 90 Tab 5   ??? DULoxetine (CYMBALTA) 60 mg capsule TAKE 1 CAPSULE BY MOUTH EVERY DAY 90 Cap 3   ???  drospirenone-ethinyl estradiol (YASMIN) 3-0.03 mg tab TAKE 1 TABLET BY MOUTH EVERY DAY 3 Dose Pack 3   ??? loratadine (CLARITIN) 10 mg tablet TAKE 1 TABLET BY MOUTH EVERY DAY 30 Tab 10   ??? ALPRAZolam (XANAX) 0.5 mg tablet Take 1 Tab by mouth two (2) times daily as needed for Anxiety. Max Daily Amount: 1 mg. 60 Tab 0   ??? predniSONE (STERAPRED DS) 10 mg dose pack See administration instruction per 10mg  dose pack - 6 days 21 Tab 0   ??? plecanatide (TRULANCE) 3 mg tab Take 1 Tab by mouth daily. 90 Tab 1   ??? esomeprazole (NEXIUM) 20 mg capsule Take  by mouth daily.       Allergies: Codeine   Social History     Socioeconomic History   ??? Marital status: MARRIED     Spouse name: Not on file   ??? Number of children: Not on file   ??? Years of education: Not on file   ??? Highest education level: Not  on file   Occupational History   ??? Not on file   Social Needs   ??? Financial resource strain: Not on file   ??? Food insecurity     Worry: Not on file     Inability: Not on file   ??? Transportation needs     Medical: Not on file     Non-medical: Not on file   Tobacco Use   ??? Smoking status: Never Smoker   ??? Smokeless tobacco: Never Used   Substance and Sexual Activity   ??? Alcohol use: Yes     Alcohol/week: 5.8 standard drinks     Types: 7 Glasses of wine per week     Comment: 7/wk   ??? Drug use: No   ??? Sexual activity: Yes     Partners: Male     Birth control/protection: Pill   Lifestyle   ??? Physical activity     Days per week: Not on file     Minutes per session: Not on file   ??? Stress: Not on file   Relationships   ??? Social Wellsite geologistconnections     Talks on phone: Not on file     Gets together: Not on file     Attends religious service: Not on file     Active member of club or organization: Not on file     Attends meetings of clubs or organizations: Not on file     Relationship status: Not on file   ??? Intimate partner violence     Fear of current or ex partner: Not on file     Emotionally abused: Not on file     Physically abused: Not on file      Forced sexual activity: Not on file   Other Topics Concern   ??? Not on file   Social History Narrative   ??? Not on file     Tobacco History:  reports that she has never smoked. She has never used smokeless tobacco.  Alcohol Abuse:  reports current alcohol use of about 5.8 standard drinks of alcohol per week.  Drug Abuse:  reports no history of drug use.  Patient Active Problem List   Diagnosis Code   ??? SUI (stress urinary incontinence, female) N39.3   ??? 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 Lateral epicondylitis of left elbow M77.12         Review of Systems - History obtained from the patient  Constitutional: negative for weight loss, fever, night sweats  HEENT: negative for hearing loss, earache, congestion, snoring, sorethroat  CV: negative for chest pain, palpitations, edema  Resp: negative for cough, shortness of breath, wheezing  GI: negative for change in bowel habits, abdominal pain, black or bloody stools  GU: negative for frequency, dysuria, hematuria, vaginal discharge  MSK: negative for back pain, joint pain, muscle pain  Breast: negative for breast lumps, nipple discharge, galactorrhea  Skin :negative for itching, rash, hives  Neuro: negative for dizziness, headache, confusion, weakness  Psych: negative for anxiety, depression, change in mood  Heme/lymph: negative for bleeding, bruising, pallor    Physical Exam    Visit Vitals  BP 124/70   Ht 5\' 8"  (1.727 m)   Wt 134 lb (60.8 kg)   LMP 05/27/2019 (Exact Date)   BMI 20.37 kg/m??     Constitutional  ?? Appearance: well-nourished, well developed, alert, in no acute distress    HENT  ?? Head and Face: appears normal    Neck  ??  Inspection/Palpation: normal appearance, no masses or tenderness  Lymph Nodes: no lymphadenopathy present    Chest  ?? Respiratory Effort: breathing normal    Breasts  ?? Inspection of Breasts: breasts symmetrical, no skin changes, no discharge present, nipple appearance normal, no skin retraction present  ?? Palpation of Breasts and Axillae: no  masses present on palpation, no breast tenderness  ?? Axillary Lymph Nodes: no lymphadenopathy present    Gastrointestinal  ?? Abdominal Examination: abdomen non-tender to palpation, normal bowel sounds, no masses present  ?? Liver and spleen: no hepatomegaly present, spleen not palpable  ?? Hernias: no hernias identified    Skin  ?? General Inspection: no rash, no lesions identified    Neurologic/Psychiatric  ?? Mental Status:  ?? Orientation: grossly oriented to person, place and time  ?? Mood and Affect: mood normal, affect appropriate    Genitourinary  ?? External Genitalia: normal appearance for age, no discharge present, no tenderness present, no inflammatory lesions present, no masses present, no atrophy present  ?? Vagina: normal vaginal vault without central or paravaginal defects, no discharge present, no inflammatory lesions present, no masses present  ?? Bladder: non-tender to palpation  ?? Urethra: appears normal  ?? Cervix: normal   ?? Uterus: normal size, shape and consistency  ?? Adnexa: no adnexal tenderness present, no adnexal masses present  ?? Perineum: perineum within normal limits, no evidence of trauma, no rashes or skin lesions present  ?? Anus: anus within normal limits, no hemorrhoids present  ?? Inguinal Lymph Nodes: no lymphadenopathy present    Assessment:  Routine gynecologic examination  Her current medical status is satisfactory with no evidence of significant gynecologic issues.    Plan:  Counseled re: diet, exercise, healthy lifestyle  Return for yearly wellness visits  Rec annual mammogram  Patient Verbalized understanding

## 2019-06-02 NOTE — Progress Notes (Signed)
Your mammogram is normal

## 2019-06-02 NOTE — Progress Notes (Signed)
Leslie Harrell is a G3 P54,  48 y.o. [place] whose LMP was on 05/27/2019 who presents for her annual checkup. She is having no significant problems.    Menstrual status:    Her periods are moderatet in flow. She is using three to five pads or tampons per day, usually regular and last 26-30 days.    She denies dysmenorrhea.    She reports no premenstrual symptoms.    The patient is not using HRT.    Contraception:    The current method of family planning is vasectomy and OCP (estrogen/progesterone).    Sexual history:    She  reports being sexually active and has had partner(s) who are Female. She reports using the following method of birth control/protection: Pill.    Medical conditions:    Since her last annual GYN exam about one year ago, she has had the following changes in her health history: none.       Pap and Mammogram History:    Her most recent Pap smear was normal obtained 2 year(s) ago.    The patient has mammo scheduled for today downstairs at [facility].    Breast Cancer History/Substance Abuse:    She has a family history of breast cancer.      Osteoporosis History:    Family history does not include a first or second degree relative with osteopenia or osteoporosis.    A bone density scan has not been previously obtained.       Past Medical History:   Diagnosis Date   ??? Bursitis    ??? GERD (gastroesophageal reflux disease)    ??? History of shingles    ??? PMDD (premenstrual dysphoric disorder)      Past Surgical History:   Procedure Laterality Date   ??? HX APPENDECTOMY  1987   ??? HX CESAREAN SECTION  1999, 2002   ??? HX CYSTECTOMY      ovarian   ??? HX GYN      TVT   ??? HX HEMORRHOIDECTOMY     ??? HX TONSIL AND ADENOIDECTOMY  1986   ??? HX UROLOGICAL  12/2010    bladder sling     Current Outpatient Medications   Medication Sig Dispense Refill   ??? traZODone (DESYREL) 100 mg tablet Take 1 Tab by mouth nightly. 90 Tab 5   ??? DULoxetine (CYMBALTA) 60 mg capsule TAKE 1 CAPSULE BY MOUTH EVERY DAY 90 Cap 3    ??? drospirenone-ethinyl estradiol (YASMIN) 3-0.03 mg tab TAKE 1 TABLET BY MOUTH EVERY DAY 3 Dose Pack 3   ??? loratadine (CLARITIN) 10 mg tablet TAKE 1 TABLET BY MOUTH EVERY DAY 30 Tab 10   ??? ALPRAZolam (XANAX) 0.5 mg tablet Take 1 Tab by mouth two (2) times daily as needed for Anxiety. Max Daily Amount: 1 mg. 60 Tab 0   ??? predniSONE (STERAPRED DS) 10 mg dose pack See administration instruction per 10mg  dose pack - 6 days 21 Tab 0   ??? plecanatide (TRULANCE) 3 mg tab Take 1 Tab by mouth daily. 90 Tab 1   ??? esomeprazole (NEXIUM) 20 mg capsule Take  by mouth daily.       Allergies: Codeine   Social History     Socioeconomic History   ??? Marital status: MARRIED     Spouse name: Not on file   ??? Number of children: Not on file   ??? Years of education: Not on file   ??? Highest education level: Not  on file   Occupational History   ??? Not on file   Social Needs   ??? Financial resource strain: Not on file   ??? Food insecurity     Worry: Not on file     Inability: Not on file   ??? Transportation needs     Medical: Not on file     Non-medical: Not on file   Tobacco Use   ??? Smoking status: Never Smoker   ??? Smokeless tobacco: Never Used   Substance and Sexual Activity   ??? Alcohol use: Yes     Alcohol/week: 5.8 standard drinks     Types: 7 Glasses of wine per week     Comment: 7/wk   ??? Drug use: No   ??? Sexual activity: Yes     Partners: Male     Birth control/protection: Pill   Lifestyle   ??? Physical activity     Days per week: Not on file     Minutes per session: Not on file   ??? Stress: Not on file   Relationships   ??? Social Wellsite geologistconnections     Talks on phone: Not on file     Gets together: Not on file     Attends religious service: Not on file     Active member of club or organization: Not on file     Attends meetings of clubs or organizations: Not on file     Relationship status: Not on file   ??? Intimate partner violence     Fear of current or ex partner: Not on file     Emotionally abused: Not on file     Physically abused: Not on file      Forced sexual activity: Not on file   Other Topics Concern   ??? Not on file   Social History Narrative   ??? Not on file     Tobacco History:  reports that she has never smoked. She has never used smokeless tobacco.  Alcohol Abuse:  reports current alcohol use of about 5.8 standard drinks of alcohol per week.  Drug Abuse:  reports no history of drug use.  Patient Active Problem List   Diagnosis Code   ??? SUI (stress urinary incontinence, female) N39.3   ??? 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 Lateral epicondylitis of left elbow M77.12         Review of Systems - History obtained from the patient  Constitutional: negative for weight loss, fever, night sweats  HEENT: negative for hearing loss, earache, congestion, snoring, sorethroat  CV: negative for chest pain, palpitations, edema  Resp: negative for cough, shortness of breath, wheezing  GI: negative for change in bowel habits, abdominal pain, black or bloody stools  GU: negative for frequency, dysuria, hematuria, vaginal discharge  MSK: negative for back pain, joint pain, muscle pain  Breast: negative for breast lumps, nipple discharge, galactorrhea  Skin :negative for itching, rash, hives  Neuro: negative for dizziness, headache, confusion, weakness  Psych: negative for anxiety, depression, change in mood  Heme/lymph: negative for bleeding, bruising, pallor    Physical Exam    Visit Vitals  BP 124/70   Ht 5\' 8"  (1.727 m)   Wt 134 lb (60.8 kg)   LMP 05/27/2019 (Exact Date)   BMI 20.37 kg/m??     Constitutional  ?? Appearance: well-nourished, well developed, alert, in no acute distress    HENT  ?? Head and Face: appears normal    Neck  ??  Inspection/Palpation: normal appearance, no masses or tenderness  Lymph Nodes: no lymphadenopathy present    Chest  ?? Respiratory Effort: breathing normal    Breasts  ?? Inspection of Breasts: breasts symmetrical, no skin changes, no discharge present, nipple appearance normal, no skin retraction present   ?? Palpation of Breasts and Axillae: no masses present on palpation, no breast tenderness  ?? Axillary Lymph Nodes: no lymphadenopathy present    Gastrointestinal  ?? Abdominal Examination: abdomen non-tender to palpation, normal bowel sounds, no masses present  ?? Liver and spleen: no hepatomegaly present, spleen not palpable  ?? Hernias: no hernias identified    Skin  ?? General Inspection: no rash, no lesions identified    Neurologic/Psychiatric  ?? Mental Status:  ?? Orientation: grossly oriented to person, place and time  ?? Mood and Affect: mood normal, affect appropriate    Genitourinary  ?? External Genitalia: normal appearance for age, no discharge present, no tenderness present, no inflammatory lesions present, no masses present, no atrophy present  ?? Vagina: normal vaginal vault without central or paravaginal defects, no discharge present, no inflammatory lesions present, no masses present  ?? Bladder: non-tender to palpation  ?? Urethra: appears normal  ?? Cervix: normal   ?? Uterus: normal size, shape and consistency  ?? Adnexa: no adnexal tenderness present, no adnexal masses present  ?? Perineum: perineum within normal limits, no evidence of trauma, no rashes or skin lesions present  ?? Anus: anus within normal limits, no hemorrhoids present  ?? Inguinal Lymph Nodes: no lymphadenopathy present    Assessment:  Routine gynecologic examination  Her current medical status is satisfactory with no evidence of significant gynecologic issues.    Plan:  Counseled re: diet, exercise, healthy lifestyle  Return for yearly wellness visits  Rec annual mammogram  Patient Verbalized understanding

## 2019-07-24 MED ORDER — DROSPIRENONE 3 MG-ETHINYL ESTRADIOL 0.03 MG TABLET
ORAL | 3 refills | Status: AC
Start: 2019-07-24 — End: ?

## 2019-07-24 NOTE — Telephone Encounter (Signed)
Message left at 12:39PM      48 year old patient last seen in the office on 06/02/2019    Patient left a message asking for her ocp prescription to be sent to a new pharmacy since she is moving to [place].    Prescription resent as per MD order to patient requested pharmacy.    This nurse attempted to reach the patient and left a message for the patient that her prescription has been resent to her requested pharmacy in [place]

## 2019-12-15 ENCOUNTER — Encounter

## 2019-12-15 MED ORDER — DULOXETINE 60 MG CAP, DELAYED RELEASE
60 mg | ORAL_CAPSULE | ORAL | 1 refills | Status: AC
Start: 2019-12-15 — End: ?

## 2020-01-04 ENCOUNTER — Ambulatory Visit: Payer: Self-pay | Attending: Internal Medicine

## 2020-01-04 DIAGNOSIS — Z23 Encounter for immunization: Secondary | ICD-10-CM | POA: Insufficient documentation

## 2020-01-04 NOTE — Progress Notes (Signed)
   Covid-19 Vaccination Clinic  Name:  Michelle Franco    MRN: [id number] DOB: Jul 16, 1971  01/04/2020  Michelle Franco was observed post Covid-19 immunization for 15 minutes without incidence. She was provided with Vaccine Information Sheet and instruction to access the V-Safe system.   Michelle Franco was instructed to call 911 with any severe reactions post vaccine: Marland Kitchen Difficulty breathing  . Swelling of your face and throat  . A fast heartbeat  . A bad rash all over your body  . Dizziness and weakness    Immunizations Administered    Name Date Dose VIS Date Route   Pfizer COVID-19 Vaccine 01/04/2020 11:57 AM 0.3 mL 10/17/2019 Intramuscular   Manufacturer: [place]   Lot: [id number]   [place]: [id number]

## 2020-01-28 ENCOUNTER — Ambulatory Visit: Payer: Self-pay | Attending: Internal Medicine

## 2020-01-28 DIAGNOSIS — Z23 Encounter for immunization: Secondary | ICD-10-CM

## 2020-01-28 NOTE — Progress Notes (Signed)
   Covid-19 Vaccination Clinic  Name:  Michelle Franco    MRN: [id number] DOB: 1971/09/06  01/28/2020  Ms. Smet was observed post Covid-19 immunization for 15 minutes without incident. She was provided with Vaccine Information Sheet and instruction to access the V-Safe system.   Ms. Carrie was instructed to call 911 with any severe reactions post vaccine: Marland Kitchen Difficulty breathing  . Swelling of face and throat  . A fast heartbeat  . A bad rash all over body  . Dizziness and weakness   Immunizations Administered    Name Date Dose VIS Date Route   Pfizer COVID-19 Vaccine 01/28/2020  4:26 PM 0.3 mL 10/17/2019 Intramuscular   Manufacturer: [place]   Lot: [id number]   [place]: [id number]

## 2020-04-20 DIAGNOSIS — K581 Irritable bowel syndrome with constipation: Secondary | ICD-10-CM | POA: Insufficient documentation

## 2020-04-20 DIAGNOSIS — M778 Other enthesopathies, not elsewhere classified: Secondary | ICD-10-CM | POA: Insufficient documentation

## 2020-04-20 DIAGNOSIS — F411 Generalized anxiety disorder: Secondary | ICD-10-CM | POA: Insufficient documentation

## 2020-04-20 DIAGNOSIS — E782 Mixed hyperlipidemia: Secondary | ICD-10-CM | POA: Insufficient documentation

## 2020-04-21 ENCOUNTER — Ambulatory Visit: Payer: Commercial Managed Care - PPO | Admitting: Podiatry

## 2020-05-14 ENCOUNTER — Encounter: Payer: Self-pay | Admitting: Podiatry

## 2020-05-14 ENCOUNTER — Other Ambulatory Visit: Payer: Self-pay

## 2020-05-14 ENCOUNTER — Ambulatory Visit (INDEPENDENT_AMBULATORY_CARE_PROVIDER_SITE_OTHER): Payer: Commercial Managed Care - PPO | Admitting: Podiatry

## 2020-05-14 ENCOUNTER — Ambulatory Visit (INDEPENDENT_AMBULATORY_CARE_PROVIDER_SITE_OTHER): Payer: Commercial Managed Care - PPO

## 2020-05-14 DIAGNOSIS — M67472 Ganglion, left ankle and foot: Secondary | ICD-10-CM | POA: Diagnosis not present

## 2020-05-14 DIAGNOSIS — M205X2 Other deformities of toe(s) (acquired), left foot: Secondary | ICD-10-CM | POA: Diagnosis not present

## 2020-05-14 DIAGNOSIS — M2011 Hallux valgus (acquired), right foot: Secondary | ICD-10-CM | POA: Diagnosis not present

## 2020-05-14 DIAGNOSIS — M2012 Hallux valgus (acquired), left foot: Secondary | ICD-10-CM | POA: Diagnosis not present

## 2020-05-14 DIAGNOSIS — N950 Postmenopausal bleeding: Secondary | ICD-10-CM | POA: Insufficient documentation

## 2020-05-14 DIAGNOSIS — M205X1 Other deformities of toe(s) (acquired), right foot: Secondary | ICD-10-CM | POA: Diagnosis not present

## 2020-05-14 DIAGNOSIS — M2010 Hallux valgus (acquired), unspecified foot: Secondary | ICD-10-CM

## 2020-05-14 NOTE — Progress Notes (Signed)
   HPI: 49 y.o. female presenting today as a new patient for evaluation of bilateral foot pains.  Patient states that she has bunions bilaterally but now she is developing fluid to her left great toe joint.  She does have some discomfort with activity.  She states that over the past year her left foot has progressively gotten worse.  She experiences pain with significant pressure to the great toe joint.  She has been taking anti-inflammatories for the pain.  She would like to discuss different treatment options.  She presents for further treatment evaluation  No past medical history on file.   Physical Exam: General: The patient is alert and oriented x3 in no acute distress.  Dermatology: Skin is warm, dry and supple bilateral lower extremities. Negative for open lesions or macerations.  Vascular: Palpable pedal pulses bilaterally. No edema or erythema noted. Capillary refill within normal limits.  Neurological: Epicritic and protective threshold grossly intact bilaterally.   Musculoskeletal Exam: Range of motion within normal limits to all pedal and ankle joints bilateral. Muscle strength 5/5 in all groups bilateral.  There is a fluctuant nonadhered mass noted to the dorsal aspect of the first MTPJ of the left foot.  Findings consistent with a ganglion cyst or mucoid cyst.  Radiographic Exam:  Normal osseous mineralization.  Slightly increased intermetatarsal angle with a hallux abductus angle consistent with mild bunion deformity.  There is some joint space narrowing with periarticular spurring noted to the first MTPJ bilateral.  Best visualized on medial oblique view  Assessment: 1.  DJD/mild bunion deformity bilateral 2.  Ganglion cyst left first MTPJ   Plan of Care:  1. Patient evaluated. X-Rays reviewed.  2.  Today we discussed different treatment options both conservatively and surgically.  I do believe the patient would benefit from conservative treatment at the moment. 3.  Today  we discussed addressing the ganglion cyst to the dorsal aspect of the left foot.  Patient would like to have the cyst drained.  I do agree and the foot was prepped in aseptic manner and 3 mL of 2% lidocaine plain was infiltrated just proximal to the first MTPJ.  An 18-gauge needle was penetrated into the ganglion cyst and approximately 2 mL of viscous fluid was removed from the ganglion cyst.  Light dressing applied. 4.  Continue full activity no restrictions.  Recommend OTC anti-inflammatory as needed 5.  Return to clinic as needed  *Special ed teacher      Edrick Kins, DPM [facility]  Dr. Edrick Kins, DPM    [address], [place] [address]                Office [phone]  Fax [phone]

## 2020-06-22 ENCOUNTER — Other Ambulatory Visit: Payer: Self-pay | Admitting: Obstetrics and Gynecology

## 2020-06-22 DIAGNOSIS — Z1231 Encounter for screening mammogram for malignant neoplasm of breast: Secondary | ICD-10-CM

## 2020-08-24 ENCOUNTER — Ambulatory Visit
Admission: RE | Admit: 2020-08-24 | Discharge: 2020-08-24 | Disposition: A | Discharge status: Home / Self Care | Payer: Commercial Managed Care - PPO | Source: Ambulatory Visit | Attending: Obstetrics and Gynecology | Admitting: Obstetrics and Gynecology

## 2020-08-24 ENCOUNTER — Other Ambulatory Visit: Payer: Self-pay

## 2020-08-24 DIAGNOSIS — Z1231 Encounter for screening mammogram for malignant neoplasm of breast: Secondary | ICD-10-CM | POA: Diagnosis not present

## 2020-09-21 ENCOUNTER — Other Ambulatory Visit: Payer: Self-pay | Admitting: Obstetrics and Gynecology

## 2020-10-06 NOTE — H&P (Signed)
Michelle Franco is a 49 y.o. female here forFx D+C , hysteroscopy and Novasure ablation Pt continues to have PMB . EMBX 7/ 2021 - proliferative. She has been on prometrium and lysteda . Still with spotting and flow and cramping     Past Medical History:  has a past medical history of Allergy, Anxiety, Depression, GERD (gastroesophageal reflux disease), Headache, unspecified, Hemorrhoids, and Menstrual problem, unspecified.  Past Surgical History:  has a past surgical history that includes Appendectomy; Tonsillectomy; TVT (12/2011); Cesarean section (1999); and Cesarean section (2002). Family History: family history includes Alcohol abuse in her maternal grandmother; Anxiety in her daughter; Breast cancer (age of onset: 82) in her paternal grandmother; Dementia in her paternal grandfather; Diabetes type II in her brother and father; Ehlers-Danlos syndrome in her daughter; Berenice Primas' disease in her mother; Hyperlipidemia (Elevated cholesterol) in her father and mother; Melanoma in her father; Mental illness in her maternal grandmother; No Known Problems in her maternal grandfather, son, and son; Thyroid cancer in her father; Thyroid disease in her father. Social History:  reports that she has never smoked. She has never used smokeless tobacco. She reports current alcohol use. She reports that she does not use drugs. OB/GYN History:          OB History    Gravida  3   Para  3   Term      Preterm      AB      Living  3     SAB      IAB      Ectopic      Molar      Multiple      Live Births  3          Allergies: is allergic to codeine. Medications:  Current Outpatient Medications:  .  ALPRAZolam (XANAX) 0.5 MG tablet, Take 0.5 mg by mouth nightly as needed for Sleep, Disp: , Rfl:  .  cetirizine (ZYRTEC) 10 MG tablet, Take 10 mg by mouth once daily, Disp: , Rfl:  .  DULoxetine (CYMBALTA) 60 MG DR capsule, Take 60 mg by mouth once daily, Disp: , Rfl:  .  LINZESS 290 mcg  capsule, TAKE 1 CAPSULE (290 MCG TOTAL) BY MOUTH ONCE DAILY, Disp: 30 capsule, Rfl: 4 .  tranexamic acid (LYSTEDA) 650 mg tablet, Take 2 tablets (1,300 mg total) by mouth 3 (three) times daily Take for a maximum of 5 days during monthly menstruation., Disp: 30 tablet, Rfl: 0 .  traZODone (DESYREL) 50 MG tablet, Take 1 tablet (50 mg total) by mouth nightly, Disp: 90 tablet, Rfl: 1 .  meloxicam (MOBIC) 15 MG tablet, TAKE 1 TABLET BY MOUTH EVERY DAY, Disp: 30 tablet, Rfl: 1 .  progesterone (PROMETRIUM) 200 MG capsule, Take 1 capsule (200 mg total) by mouth nightly (Patient not taking: Reported on 06/01/2020  ), Disp: 30 capsule, Rfl: 3  Review of Systems: General:                      No fatigue or weight loss Eyes:                           No vision changes Ears:                            No hearing difficulty Respiratory:  No cough or shortness of breath Pulmonary:                  No asthma or shortness of breath Cardiovascular:           No chest pain, palpitations, dyspnea on exertion Gastrointestinal:          No abdominal bloating, chronic diarrhea, constipations, masses, pain or hematochezia Genitourinary:             No hematuria, dysuria, abnormal vaginal discharge, pelvic pain, Menometrorrhagia Lymphatic:                   No swollen lymph nodes Musculoskeletal:         No muscle weakness Neurologic:                  No extremity weakness, syncope, seizure disorder Psychiatric:                  No history of depression, delusions or suicidal/homicidal ideation    Exam:      Vitals:   10/06/20 1603  BP: 107/74  Pulse: 79    Body mass index is 20.98 kg/m.  WDWN white/  female in NAD   Lungs: CTA  CV : RRR without murmur    Neck:  no thyromegaly Abdomen: soft , no mass, normal active bowel sounds,  non-tender, no rebound tenderness Pelvic: tanner stage 5 ,  External genitalia: vulva /labia no lesions Urethra: no prolapse Vagina: normal physiologic  d/c Cervix: no lesions, no cervical motion tenderness   Uterus: normal size shape and contour, non-tender Adnexa: no mass,  non-tender   Rectovaginal:   Impression:   The encounter diagnosis was PMB (postmenopausal bleeding).  Failing conservative TX   Plan:  I have spoken with the patient regarding treatment options including expectant management, hormonal options, or surgical intervention. After a full discussion the pt elects to proceed with  Fractional D+C and Novasure ablation  Failure rate discussed .  Benefits and risks to surgery: The proposed benefit of the surgery has been discussed with the patient. The possible risks include, but are not limited to: organ injury to the bowel , bladder, ureters, and major blood vessels and nerves. There is a possibility of additional surgeries resulting from these injuries. There is also the risk of blood transfusion and the need to receive blood products during or after the procedure which may rarely lead to HIV or Hepatitis C infection. There is a risk of developing a deep venous thrombosis or a pulmonary embolism . There is the possibility of wound infection and also anesthetic complications, even the rare possibility of death. The patient understands these risks and wishes to proceed. All questions have been answered     Return for preop.  Caroline Sauger, MD

## 2020-10-13 ENCOUNTER — Encounter
Admission: RE | Admit: 2020-10-13 | Discharge: 2020-10-13 | Disposition: A | Discharge status: Home / Self Care | Payer: Commercial Managed Care - PPO | Source: Ambulatory Visit | Attending: Obstetrics and Gynecology | Admitting: Obstetrics and Gynecology

## 2020-10-13 ENCOUNTER — Other Ambulatory Visit: Payer: Self-pay

## 2020-10-13 HISTORY — DX: Anxiety disorder, unspecified: F41.9

## 2020-10-13 HISTORY — DX: Depression, unspecified: F32.A

## 2020-10-13 HISTORY — DX: Gastro-esophageal reflux disease without esophagitis: K21.9

## 2020-10-13 NOTE — Patient Instructions (Signed)
Your procedure is scheduled on: October 22, 2020 Friday  Report to the Registration Desk on the 1st floor of the [facility]. To find out your arrival time, please call [phone] between 1PM - 3PM on: Thursday October 21, 2020  REMEMBER: Instructions that are not followed completely may result in serious medical risk, up to and including death; or upon the discretion of your surgeon and anesthesiologist your surgery may need to be rescheduled.  Do not eat food after midnight the night before surgery.  No gum chewing, lozengers or hard candies.  You may however, drink CLEAR liquids up to 2 hours before you are scheduled to arrive for your surgery. Do not drink anything within 2 hours of your scheduled arrival time.  Clear liquids include: - water  - apple juice without pulp - black coffee or tea (Do NOT add milk or creamers to the coffee or tea) Do NOT drink anything that is not on this list.  Type 1 and Type 2 diabetics should only drink water.  In addition, your doctor has ordered for you to drink the provided  Ensure Pre-Surgery Clear Carbohydrate Drink  Drinking this carbohydrate drink up to two hours before surgery helps to reduce insulin resistance and improve patient outcomes. Please complete drinking 2 hours prior to scheduled arrival time.  TAKE THESE MEDICATIONS THE MORNING OF SURGERY WITH A SIP OF WATER: CYMBALTA LINZESS LORATADINE  USE FLONASE DAY OF SURGERY   One week prior to surgery: Stop Anti-inflammatories (NSAIDS) such as Advil, Aleve, Ibuprofen, Motrin, Naproxen, Naprosyn and  ASPIRIN OR Aspirin based products such as Excedrin, Goodys Powder, BC Powder.        USE TYLENOL IF NEEDED Stop ANY OVER THE COUNTER supplements until after surgery. (However, you may continue taking Vitamin D, Vitamin B, and multivitamin up until the day before surgery.)  No Alcohol for 24 hours before or after surgery.  No Smoking including e-cigarettes for 24 hours prior to  surgery.  No chewable tobacco products for at least 6 hours prior to surgery.  No nicotine patches on the day of surgery.  Do not use any "recreational" drugs for at least a week prior to your surgery.  Please be advised that the combination of cocaine and anesthesia may have negative outcomes, up to and including death. If you test positive for cocaine, your surgery will be cancelled.  On the morning of surgery brush your teeth with toothpaste and water, you may rinse your mouth with mouthwash if you wish. Do not swallow any toothpaste or mouthwash.  Do not wear jewelry, make-up, hairpins, clips or nail polish.  Do not wear lotions, powders, or perfumes.   Do not shave body from the neck down 48 hours prior to surgery just in case you cut yourself which could leave a site for infection.  Also, freshly shaved skin may become irritated if using the CHG soap.  Contact lenses, hearing aids and dentures may not be worn into surgery.  Do not bring valuables to the hospital. [facility] is not responsible for any missing/lost belongings or valuables.   SHOWER MORNING OF SURGERY   Notify your doctor if there is any change in your medical condition (cold, fever, infection).  Wear comfortable clothing (specific to your surgery type) to the hospital.  Plan for stool softeners for home use; pain medications have a tendency to cause constipation. You can also help prevent constipation by eating foods high in fiber such as fruits and vegetables and drinking  plenty of fluids as your diet allows.  After surgery, you can help prevent lung complications by doing breathing exercises.  Take deep breaths and cough every 1-2 hours. Your doctor may order a device called an Incentive Spirometer to help you take deep breaths. When coughing or sneezing, hold a pillow firmly against your incision with both hands. This is called "splinting." Doing this helps protect your incision. It also decreases belly  discomfort.  If you are being discharged the day of surgery, you will not be allowed to drive home. You will need a responsible adult (18 years or older) to drive you home and stay with you that night.   Please call the [facility] Dept. at [phone] if you have any questions about these instructions.  Visitation Policy:  Patients undergoing a surgery or procedure may have one family member or support person with them as long as that person is not COVID-19 positive or experiencing its symptoms.  That person may remain in the waiting area during the procedure.  Inpatient Visitation Update:   In an effort to ensure the safety of our team members and our patients, we are implementing a change to our visitation policy:  Effective Monday, Aug. 9, at 7 a.m., inpatients will be allowed one support person.  o The support person may change daily.  o The support person must pass our screening, gel in and out, and wear a mask at all times, including in the patient's room.  o Patients must also wear a mask when staff or their support person are in the room.  o Masking is required regardless of vaccination status.  Systemwide, no visitors 17 or younger.

## 2020-10-20 ENCOUNTER — Other Ambulatory Visit
Admission: RE | Admit: 2020-10-20 | Discharge: 2020-10-20 | Disposition: A | Discharge status: Home / Self Care | Payer: Commercial Managed Care - PPO | Source: Ambulatory Visit | Attending: Obstetrics and Gynecology | Admitting: Obstetrics and Gynecology

## 2020-10-20 ENCOUNTER — Other Ambulatory Visit: Payer: Self-pay

## 2020-10-20 DIAGNOSIS — Z20822 Contact with and (suspected) exposure to covid-19: Secondary | ICD-10-CM | POA: Diagnosis not present

## 2020-10-20 DIAGNOSIS — Z01812 Encounter for preprocedural laboratory examination: Secondary | ICD-10-CM | POA: Insufficient documentation

## 2020-10-20 LAB — BASIC METABOLIC PANEL
Anion gap: 10 (ref 5–15)
BUN: 14 mg/dL (ref 6–20)
CO2: 28 mmol/L (ref 22–32)
Calcium: 9.3 mg/dL (ref 8.9–10.3)
Chloride: 101 mmol/L (ref 98–111)
Creatinine, Ser: 0.85 mg/dL (ref 0.44–1.00)
GFR, Estimated: >60 mL/min (ref >60)
Glucose, Bld: 69 mg/dL — ABNORMAL LOW (ref 70–99)
Potassium: 3.9 mmol/L (ref 3.5–5.1)
Sodium: 139 mmol/L (ref 135–145)

## 2020-10-20 LAB — CBC
HCT: 41.7 % (ref 36.0–46.0)
Hemoglobin: 14 g/dL (ref 12.0–15.0)
MCH: 31.7 pg (ref 26.0–34.0)
MCHC: 33.6 g/dL (ref 30.0–36.0)
MCV: 94.6 fL (ref 80.0–100.0)
Platelets: 217 10*3/uL (ref 150–400)
RBC: 4.41 MIL/uL (ref 3.87–5.11)
RDW: 11.9 % (ref 11.5–15.5)
WBC: 5 10*3/uL (ref 4.0–10.5)
nRBC: 0 % (ref 0.0–0.2)

## 2020-10-20 LAB — TYPE AND SCREEN
ABO/RH(D): A POS
Antibody Screen: NEGATIVE

## 2020-10-20 LAB — SARS CORONAVIRUS 2 (TAT 6-24 HRS): SARS Coronavirus 2: NEGATIVE

## 2020-10-22 ENCOUNTER — Ambulatory Visit: Payer: Commercial Managed Care - PPO | Admitting: Certified Registered Nurse Anesthetist

## 2020-10-22 ENCOUNTER — Encounter: Payer: Self-pay | Admitting: Obstetrics and Gynecology

## 2020-10-22 ENCOUNTER — Other Ambulatory Visit: Payer: Self-pay

## 2020-10-22 ENCOUNTER — Ambulatory Visit
Admission: RE | Admit: 2020-10-22 | Discharge: 2020-10-22 | Disposition: A | Discharge status: Home / Self Care | Payer: Commercial Managed Care - PPO | Attending: Obstetrics and Gynecology | Admitting: Obstetrics and Gynecology

## 2020-10-22 ENCOUNTER — Encounter
Admission: RE | Disposition: A | Discharge status: Home / Self Care | Payer: Self-pay | Source: Home / Self Care | Attending: Obstetrics and Gynecology

## 2020-10-22 DIAGNOSIS — F32A Depression, unspecified: Secondary | ICD-10-CM | POA: Insufficient documentation

## 2020-10-22 DIAGNOSIS — Z808 Family history of malignant neoplasm of other organs or systems: Secondary | ICD-10-CM | POA: Insufficient documentation

## 2020-10-22 DIAGNOSIS — Z833 Family history of diabetes mellitus: Secondary | ICD-10-CM | POA: Diagnosis not present

## 2020-10-22 DIAGNOSIS — Z818 Family history of other mental and behavioral disorders: Secondary | ICD-10-CM | POA: Insufficient documentation

## 2020-10-22 DIAGNOSIS — Z803 Family history of malignant neoplasm of breast: Secondary | ICD-10-CM | POA: Insufficient documentation

## 2020-10-22 DIAGNOSIS — K219 Gastro-esophageal reflux disease without esophagitis: Secondary | ICD-10-CM | POA: Insufficient documentation

## 2020-10-22 DIAGNOSIS — N950 Postmenopausal bleeding: Secondary | ICD-10-CM | POA: Insufficient documentation

## 2020-10-22 DIAGNOSIS — Z811 Family history of alcohol abuse and dependence: Secondary | ICD-10-CM | POA: Diagnosis not present

## 2020-10-22 DIAGNOSIS — Z5309 Procedure and treatment not carried out because of other contraindication: Secondary | ICD-10-CM | POA: Diagnosis not present

## 2020-10-22 DIAGNOSIS — D261 Other benign neoplasm of corpus uteri: Secondary | ICD-10-CM | POA: Insufficient documentation

## 2020-10-22 DIAGNOSIS — F419 Anxiety disorder, unspecified: Secondary | ICD-10-CM | POA: Insufficient documentation

## 2020-10-22 DIAGNOSIS — Z8349 Family history of other endocrine, nutritional and metabolic diseases: Secondary | ICD-10-CM | POA: Diagnosis not present

## 2020-10-22 DIAGNOSIS — Z885 Allergy status to narcotic agent status: Secondary | ICD-10-CM | POA: Insufficient documentation

## 2020-10-22 DIAGNOSIS — Z809 Family history of malignant neoplasm, unspecified: Secondary | ICD-10-CM | POA: Insufficient documentation

## 2020-10-22 DIAGNOSIS — Z79899 Other long term (current) drug therapy: Secondary | ICD-10-CM | POA: Insufficient documentation

## 2020-10-22 DIAGNOSIS — Z791 Long term (current) use of non-steroidal anti-inflammatories (NSAID): Secondary | ICD-10-CM | POA: Diagnosis not present

## 2020-10-22 DIAGNOSIS — Z820 Family history of epilepsy and other diseases of the nervous system: Secondary | ICD-10-CM | POA: Insufficient documentation

## 2020-10-22 HISTORY — PX: DILITATION & CURRETTAGE/HYSTROSCOPY WITH NOVASURE ABLATION: SHX5568

## 2020-10-22 LAB — POCT PREGNANCY, URINE: Preg Test, Ur: NEGATIVE

## 2020-10-22 LAB — ABO/RH: ABO/RH(D): A POS

## 2020-10-22 SURGERY — DILATATION & CURETTAGE/HYSTEROSCOPY WITH NOVASURE ABLATION
Anesthesia: General | Site: Uterus

## 2020-10-22 MED ORDER — FAMOTIDINE 20 MG PO TABS
ORAL_TABLET | ORAL | Status: AC
  Administered 2020-10-22: 06:00:00 20 mg via ORAL
  Filled 2020-10-22: qty 1

## 2020-10-22 MED ORDER — FENTANYL CITRATE (PF) 100 MCG/2ML IJ SOLN: 25.0000 ug | INTRAMUSCULAR | Status: DC | PRN

## 2020-10-22 MED ORDER — GABAPENTIN 300 MG PO CAPS: 300.0000 mg | ORAL_CAPSULE | ORAL | Status: AC

## 2020-10-22 MED ORDER — GABAPENTIN 300 MG PO CAPS
ORAL_CAPSULE | ORAL | Status: AC
  Administered 2020-10-22: 06:00:00 300 mg via ORAL
  Filled 2020-10-22: qty 1

## 2020-10-22 MED ORDER — ONDANSETRON HCL 4 MG/2ML IJ SOLN
INTRAMUSCULAR | Status: DC | PRN
  Administered 2020-10-22: 4 mg via INTRAVENOUS

## 2020-10-22 MED ORDER — CEFAZOLIN SODIUM-DEXTROSE 2-4 GM/100ML-% IV SOLN
INTRAVENOUS | Status: AC
  Filled 2020-10-22: qty 100

## 2020-10-22 MED ORDER — CHLORHEXIDINE GLUCONATE 0.12 % MT SOLN: 15.0000 mL | Freq: Once | OROMUCOSAL | Status: AC

## 2020-10-22 MED ORDER — LACTATED RINGERS IV SOLN: INTRAVENOUS | Status: DC

## 2020-10-22 MED ORDER — PROPOFOL 10 MG/ML IV BOLUS
INTRAVENOUS | Status: AC
  Filled 2020-10-22: qty 80

## 2020-10-22 MED ORDER — FENTANYL CITRATE (PF) 100 MCG/2ML IJ SOLN
INTRAMUSCULAR | Status: AC
  Filled 2020-10-22: qty 2

## 2020-10-22 MED ORDER — MIDAZOLAM HCL 2 MG/2ML IJ SOLN
INTRAMUSCULAR | Status: DC | PRN
  Administered 2020-10-22: 2 mg via INTRAVENOUS

## 2020-10-22 MED ORDER — ORAL CARE MOUTH RINSE: 15.0000 mL | Freq: Once | OROMUCOSAL | Status: AC

## 2020-10-22 MED ORDER — MIDAZOLAM HCL 2 MG/2ML IJ SOLN
INTRAMUSCULAR | Status: AC
  Filled 2020-10-22: qty 2

## 2020-10-22 MED ORDER — PROPOFOL 10 MG/ML IV BOLUS
INTRAVENOUS | Status: AC
  Filled 2020-10-22: qty 60

## 2020-10-22 MED ORDER — DEXAMETHASONE SODIUM PHOSPHATE 10 MG/ML IJ SOLN
INTRAMUSCULAR | Status: DC | PRN
  Administered 2020-10-22: 8 mg via INTRAVENOUS

## 2020-10-22 MED ORDER — KETOROLAC TROMETHAMINE 15 MG/ML IJ SOLN: 15.0000 mg | Freq: Once | INTRAMUSCULAR | Status: AC

## 2020-10-22 MED ORDER — KETOROLAC TROMETHAMINE 15 MG/ML IJ SOLN
INTRAMUSCULAR | Status: AC
  Administered 2020-10-22: 09:00:00 15 mg via INTRAVENOUS
  Filled 2020-10-22: qty 1

## 2020-10-22 MED ORDER — FENTANYL CITRATE (PF) 100 MCG/2ML IJ SOLN
INTRAMUSCULAR | Status: DC | PRN
  Administered 2020-10-22: 25 ug via INTRAVENOUS

## 2020-10-22 MED ORDER — CEFAZOLIN SODIUM-DEXTROSE 2-4 GM/100ML-% IV SOLN
2.0000 g | Freq: Once | INTRAVENOUS | Status: AC
  Administered 2020-10-22: 08:00:00 2 g via INTRAVENOUS

## 2020-10-22 MED ORDER — FAMOTIDINE 20 MG PO TABS: 20.0000 mg | ORAL_TABLET | Freq: Once | ORAL | Status: AC

## 2020-10-22 MED ORDER — PROPOFOL 10 MG/ML IV BOLUS
INTRAVENOUS | Status: DC | PRN
  Administered 2020-10-22: 130 mg via INTRAVENOUS

## 2020-10-22 MED ORDER — ACETAMINOPHEN 500 MG PO TABS
ORAL_TABLET | ORAL | Status: AC
  Administered 2020-10-22: 06:00:00 1000 mg via ORAL
  Filled 2020-10-22: qty 2

## 2020-10-22 MED ORDER — LIDOCAINE HCL (CARDIAC) PF 100 MG/5ML IV SOSY
PREFILLED_SYRINGE | INTRAVENOUS | Status: DC | PRN
  Administered 2020-10-22: 80 mg via INTRAVENOUS

## 2020-10-22 MED ORDER — CHLORHEXIDINE GLUCONATE 0.12 % MT SOLN
OROMUCOSAL | Status: AC
  Administered 2020-10-22: 06:00:00 15 mL via OROMUCOSAL
  Filled 2020-10-22: qty 15

## 2020-10-22 MED ORDER — ACETAMINOPHEN 500 MG PO TABS: 1000.0000 mg | ORAL_TABLET | ORAL | Status: AC

## 2020-10-22 MED ORDER — PROMETHAZINE HCL 25 MG/ML IJ SOLN: 6.2500 mg | INTRAMUSCULAR | Status: DC | PRN

## 2020-10-22 MED ORDER — POVIDONE-IODINE 10 % EX SWAB
2.0000 "application " | Freq: Once | CUTANEOUS | Status: AC
  Administered 2020-10-22: 2 via TOPICAL

## 2020-10-22 SURGICAL SUPPLY — 29 items
ABLATOR SURESOUND NOVASURE (ABLATOR) ×3 IMPLANT
BAG INFUSER PRESSURE 100CC (MISCELLANEOUS) ×3 IMPLANT
CANISTER SUCT 3000ML PPV (MISCELLANEOUS) ×3 IMPLANT
CATH ROBINSON RED A/P 16FR (CATHETERS) IMPLANT
COVER WAND RF STERILE (DRAPES) IMPLANT
CYLINDER CO2 NOVASURE GENER (MISCELLANEOUS) ×1 IMPLANT
DEVICE MYOSURE LITE (MISCELLANEOUS) IMPLANT
DEVICE MYOSURE REACH (MISCELLANEOUS) IMPLANT
GLOVE SURG SYN 8.0 (GLOVE) ×3 IMPLANT
GOWN STRL REUS W/ TWL LRG LVL3 (GOWN DISPOSABLE) ×1 IMPLANT
GOWN STRL REUS W/ TWL XL LVL3 (GOWN DISPOSABLE) ×1 IMPLANT
GOWN STRL REUS W/TWL LRG LVL3 (GOWN DISPOSABLE) ×3
GOWN STRL REUS W/TWL XL LVL3 (GOWN DISPOSABLE) ×3
IV NS 1000ML (IV SOLUTION) ×3
IV NS 1000ML BAXH (IV SOLUTION) ×1 IMPLANT
KIT PROCEDURE FLUENT (KITS) ×3 IMPLANT
KIT TURNOVER CYSTO (KITS) ×3 IMPLANT
MANIFOLD NEPTUNE II (INSTRUMENTS) ×3 IMPLANT
NOVASURE GENER CO2 CL (MISCELLANEOUS) ×3
PACK DNC HYST (MISCELLANEOUS) IMPLANT
PAD OB MATERNITY 4.3X12.25 (PERSONAL CARE ITEMS) ×3 IMPLANT
PAD PREP 24X41 OB/GYN DISP (PERSONAL CARE ITEMS) ×3 IMPLANT
SEAL ROD LENS SCOPE MYOSURE (ABLATOR) ×3 IMPLANT
SOL .9 NS 3000ML IRR  AL (IV SOLUTION) ×2
SOL .9 NS 3000ML IRR AL (IV SOLUTION) ×1
SOL .9 NS 3000ML IRR UROMATIC (IV SOLUTION) ×1 IMPLANT
TOWEL OR 17X26 4PK STRL BLUE (TOWEL DISPOSABLE) ×3 IMPLANT
TUBING CONNECTING 10 (TUBING) IMPLANT
TUBING CONNECTING 10' (TUBING)

## 2020-10-22 NOTE — Discharge Instructions (Addendum)
AMBULATORY SURGERY  DISCHARGE INSTRUCTIONS   1) The drugs that you were given will stay in your system until tomorrow so for the next 24 hours you should not:  A) Drive an automobile B) Make any legal decisions C) Drink any alcoholic beverage   2) You may resume regular meals tomorrow.  Today it is better to start with liquids and gradually work up to solid foods.  You may eat anything you prefer, but it is better to start with liquids, then soup and crackers, and gradually work up to solid foods.   3) Please notify your doctor immediately if you have any unusual bleeding, trouble breathing, redness and pain at the surgery site, drainage, fever, or pain not relieved by medication.    4) Additional Instructions:  TYLENOL 1,000 MG GIVEN AT [place] AT 6:27 AM.        Please contact your physician with any problems or Same Day Surgery at [phone], Monday through Friday 6 am to 4 pm, or [place] [facility] number at [phone].

## 2020-10-22 NOTE — Op Note (Signed)
Michelle Franco, MEANY MEDICAL RECORD LX:[id number] ACCOUNT [id number] DATE OF BIRTH:May 03, 1971 FACILITY: [facility] LOCATION: [facility] PHYSICIAN:Merril Isakson Josefine Class, MD  OPERATIVE REPORT  DATE OF PROCEDURE:  10/22/2020  PREOPERATIVE DIAGNOSIS:  Postmenopausal bleeding.  POSTOPERATIVE DIAGNOSES: 1.  Postmenopausal bleeding. 2.  Inadequate uterine width to accommodate NovaSure ablation.  PROCEDURE: 1.  Fractional dilation and curettage. 2.  Hysteroscopy. 3.  Aborted NovaSure endometrial ablation due to the patient's inadequate uterine cavity width.  SURGEON:  [name], MD  ANESTHESIA:  General endotracheal anesthesia.  INDICATIONS:  A 49 year old gravida 3, para 3 patient with over a year history of postmenopausal bleeding, uncontrolled with conservative treatment.  DESCRIPTION OF PROCEDURE:  After adequate general endotracheal anesthesia, the patient was placed in dorsal supine position, legs in the [place] stirrups.  The patient's abdomen, perineum and vagina were prepped and draped in normal sterile fashion.   Timeout was performed.  The patient did receive 2 grams IV Ancef prior to commencement of the case for surgical prophylaxis.  A weighted speculum was placed in the posterior vaginal vault.  Anterior cervix was grasped with a single tooth tenaculum.   Straight catheterization of the bladder yielded 150 mL clear urine.  An endocervical curettage was then performed followed by uterine sounding to 9 cm.  Cervix was then dilated to #17 Hanks dilator.  A hysteroscope was advanced into the uterus, which  showed endometrial atrophy, no lesions identified.  [place] bilaterally were normal.  Hysteroscope was removed.  Endometrial curettage was then performed with scant tissue removed.  The NovaSure ablator was then brought up to the operative field, and the  ablator was advanced into the endometrial cavity.  The array was opened; however, would not open past 1.5 cm after several  attempts.  Therefore, the NovaSure ablation portion of the procedure was aborted due to the patient's inadequate cavity width.   Procedure was terminated.  Good hemostasis was noted.  No complications.  DISPOSITION:  The patient was taken to recovery room in good condition.  ESTIMATED BLOOD LOSS:  Minimal.  INTRAOPERATIVE FLUIDS:  1000 mL.  URINE OUTPUT:  150 mL.  IN/NUANCE  D:10/22/2020 T:10/22/2020 JOB:[id number]

## 2020-10-22 NOTE — Transfer of Care (Signed)
Immediate Anesthesia Transfer of Care Note  Patient: Michelle Franco  Procedure(s) Performed: Fractional Dilitation and Curattage Aborted Novasure due to cavity width (N/A Uterus)  Patient Location: PACU  Anesthesia Type:General  Level of Consciousness: drowsy and patient cooperative  Airway & Oxygen Therapy: Patient Spontanous Breathing and Patient connected to face mask oxygen  Post-op Assessment: Report given to RN and Post -op Vital signs reviewed and stable  Post vital signs: Reviewed and stable  Last Vitals:  Vitals Value Taken Time  BP 96/63 10/22/20 0835  Temp    Pulse 72 10/22/20 0838  Resp 10 10/22/20 0838  SpO2 100 % 10/22/20 0838  Vitals shown include unvalidated device data.  Last Pain:  Vitals:   10/22/20 0618  TempSrc: Tympanic  PainSc: 0-No pain         Complications: No complications documented.

## 2020-10-22 NOTE — Progress Notes (Signed)
Pt is ready for Fx D+C , H/S and novasure ablation  All questions answered . Labs reviewed . Proceed

## 2020-10-22 NOTE — Anesthesia Postprocedure Evaluation (Signed)
Anesthesia Post Note  Patient: Michelle Franco  Procedure(s) Performed: Fractional Dilitation and Curattage Aborted Novasure due to cavity width (N/A Uterus)  Patient location during evaluation: PACU Anesthesia Type: General Level of consciousness: awake and alert Pain management: pain level controlled Vital Signs Assessment: post-procedure vital signs reviewed and stable Respiratory status: spontaneous breathing, nonlabored ventilation, respiratory function stable and patient connected to nasal cannula oxygen Cardiovascular status: blood pressure returned to baseline and stable Postop Assessment: no apparent nausea or vomiting Anesthetic complications: no   No complications documented.   Last Vitals:  Vitals:   10/22/20 0905 10/22/20 0920  BP: (!) 117/48 107/83  Pulse: 91 75  Resp: 11 14  Temp:  36.6 C  SpO2: 99% 100%    Last Pain:  Vitals:   10/22/20 0920  TempSrc: Temporal  PainSc: 0-No pain                 Martha Clan

## 2020-10-22 NOTE — Anesthesia Procedure Notes (Signed)
Procedure Name: LMA Insertion Date/Time: 10/22/2020 7:48 AM Performed by: Lowry Bowl, CRNA Pre-anesthesia Checklist: Patient identified, Emergency Drugs available, Patient being monitored and Suction available Patient Re-evaluated:Patient Re-evaluated prior to induction Oxygen Delivery Method: Circle system utilized Preoxygenation: Pre-oxygenation with 100% oxygen Induction Type: IV induction Ventilation: Mask ventilation without difficulty LMA: LMA inserted Laryngoscope Size: 4 Number of attempts: 1 Placement Confirmation: positive ETCO2 and breath sounds checked- equal and bilateral Tube secured with: Tape Dental Injury: Teeth and Oropharynx as per pre-operative assessment

## 2020-10-22 NOTE — Brief Op Note (Signed)
10/22/2020  8:29 AM  PATIENT:  Michelle Franco  49 y.o. female  PRE-OPERATIVE DIAGNOSIS:  Postmenopausal bleeding, failing conservative treatment  POST-OPERATIVE DIAGNOSIS:  Postmenopausal bleeding, failing conservative treatment  PROCEDURE:  Procedure(s): Fractional Dilitation and Curattage Aborted Novasure due to cavity width (N/A) hysteroscopy SURGEON:  Surgeon(s) and Role:    * Zacharie Portner, Gwen Her, MD - Primary  PHYSICIAN ASSISTANT: cst  ASSISTANTS: none   ANESTHESIA:   general  EBL:  5 mL , IOF 1000 cc, uo 150 cc BLOOD ADMINISTERED:none  DRAINS: none   LOCAL MEDICATIONS USED:  NONE  SPECIMEN:  Source of Specimen:  ecc and embx  DISPOSITION OF SPECIMEN:  PATHOLOGY  COUNTS:  YES  TOURNIQUET:  * No tourniquets in log *  DICTATION: .Other Dictation: Dictation Number verbal  PLAN OF CARE: Discharge to home after PACU  PATIENT DISPOSITION:  PACU - hemodynamically stable.   Delay start of Pharmacological VTE agent (>24hrs) due to surgical blood loss or risk of bleeding: not applicable

## 2020-10-22 NOTE — Anesthesia Preprocedure Evaluation (Signed)
Anesthesia Evaluation  Patient identified by MRN, date of birth, ID band Patient awake    Reviewed: Allergy & Precautions, H&P , NPO status , Patient's Chart, lab work & pertinent test results, reviewed documented beta blocker date and time   History of Anesthesia Complications Negative for: history of anesthetic complications  Airway Mallampati: II  TM Distance: >3 FB Neck ROM: full    Dental  (+) Dental Advidsory Given, Teeth Intact, Caps   Pulmonary neg pulmonary ROS,    Pulmonary exam normal breath sounds clear to auscultation       Cardiovascular Exercise Tolerance: Good negative cardio ROS Normal cardiovascular exam Rhythm:regular Rate:Normal     Neuro/Psych PSYCHIATRIC DISORDERS Anxiety Depression negative neurological ROS     GI/Hepatic Neg liver ROS, GERD  ,  Endo/Other  negative endocrine ROS  Renal/GU negative Renal ROS  negative genitourinary   Musculoskeletal   Abdominal   Peds  Hematology negative hematology ROS (+)   Anesthesia Other Findings Past Medical History: No date: Anxiety No date: Depression No date: GERD (gastroesophageal reflux disease)   Reproductive/Obstetrics negative OB ROS                             Anesthesia Physical Anesthesia Plan  ASA: II  Anesthesia Plan: General   Post-op Pain Management:    Induction: Intravenous  PONV Risk Score and Plan: 3 and Ondansetron, Dexamethasone, Midazolam and Treatment may vary due to age or medical condition  Airway Management Planned: LMA  Additional Equipment:   Intra-op Plan:   Post-operative Plan: Extubation in OR  Informed Consent: I have reviewed the patients History and Physical, chart, labs and discussed the procedure including the risks, benefits and alternatives for the proposed anesthesia with the patient or authorized representative who has indicated his/her understanding and acceptance.      Dental Advisory Given  Plan Discussed with: Anesthesiologist, CRNA and Surgeon  Anesthesia Plan Comments:         Anesthesia Quick Evaluation

## 2020-10-25 LAB — SURGICAL PATHOLOGY

## 2021-01-10 ENCOUNTER — Other Ambulatory Visit: Payer: Self-pay | Admitting: Orthopedic Surgery

## 2021-02-04 DIAGNOSIS — C4401 Basal cell carcinoma of skin of lip: Secondary | ICD-10-CM

## 2021-02-04 HISTORY — DX: Basal cell carcinoma of skin of lip: C44.01

## 2021-03-02 HISTORY — PX: MOHS SURGERY: SUR867

## 2021-04-13 ENCOUNTER — Encounter: Payer: Self-pay | Admitting: *Deleted

## 2021-04-13 ENCOUNTER — Other Ambulatory Visit: Payer: Self-pay

## 2021-04-13 ENCOUNTER — Encounter
Admission: RE | Admit: 2021-04-13 | Discharge: 2021-04-13 | Disposition: A | Discharge status: Home / Self Care | Payer: BC Managed Care – PPO | Source: Ambulatory Visit | Attending: Orthopedic Surgery | Admitting: Orthopedic Surgery

## 2021-04-13 HISTORY — DX: Hyperlipidemia, unspecified: E78.5

## 2021-04-13 HISTORY — DX: Family history of other specified conditions: Z84.89

## 2021-04-13 HISTORY — DX: Unspecified hemorrhoids: K64.9

## 2021-04-13 NOTE — Patient Instructions (Addendum)
Your procedure is scheduled on:  Tuesday, June 14 Report to the Registration Desk on the 1st floor of the [facility]. To find out your arrival time, please call [phone] between 1PM - 3PM on: Monday, June 13  REMEMBER: Instructions that are not followed completely may result in serious medical risk, up to and including death; or upon the discretion of your surgeon and anesthesiologist your surgery may need to be rescheduled.  Do not eat food after midnight the night before surgery.  No gum chewing, lozengers or hard candies.  You may however, drink CLEAR liquids up to 2 hours before you are scheduled to arrive for your surgery. Do not drink anything within 2 hours of your scheduled arrival time.  Clear liquids include: - water  - apple juice without pulp - gatorade (not RED, PURPLE, OR BLUE) - black coffee or tea (Do NOT add milk or creamers to the coffee or tea) Do NOT drink anything that is not on this list.  In addition, your doctor has ordered for you to drink the provided  Ensure Pre-Surgery Clear Carbohydrate Drink  Drinking this carbohydrate drink up to two hours before surgery helps to reduce insulin resistance and improve patient outcomes. Please complete drinking 2 hours prior to scheduled arrival time.  TAKE THESE MEDICATIONS THE MORNING OF SURGERY WITH A SIP OF WATER:  1.  Duloxetine (Cymbalta) 2.  Tramadol if needed for pain  One week prior to surgery: starting June 7 Stop Anti-inflammatories (NSAIDS) such as Advil, Aleve, Ibuprofen, Motrin, Naproxen, Naprosyn and Aspirin based products such as Excedrin, Goodys Powder, BC Powder. Stop ANY OVER THE COUNTER supplements until after surgery. Stop Multi-vitamin You may however, continue to take Tylenol if needed for pain up until the day of surgery.  No Alcohol for 24 hours before or after surgery.  No Smoking including e-cigarettes for 24 hours prior to surgery.  No chewable tobacco products for at least 6 hours  prior to surgery.  No nicotine patches on the day of surgery.  Do not use any "recreational" drugs for at least a week prior to your surgery.  Please be advised that the combination of cocaine and anesthesia may have negative outcomes, up to and including death. If you test positive for cocaine, your surgery will be cancelled.  On the morning of surgery brush your teeth with toothpaste and water, you may rinse your mouth with mouthwash if you wish. Do not swallow any toothpaste or mouthwash.  Do not wear jewelry, make-up, hairpins, clips or nail polish.  Do not wear lotions, powders, or perfumes.   Do not shave body from the neck down 48 hours prior to surgery just in case you cut yourself which could leave a site for infection.  Also, freshly shaved skin may become irritated if using the CHG soap.  Contact lenses, hearing aids and dentures may not be worn into surgery.  Do not bring valuables to the hospital. [facility] is not responsible for any missing/lost belongings or valuables.   Use CHG Soap as directed on instruction sheet.  Notify your doctor if there is any change in your medical condition (cold, fever, infection).  Wear comfortable clothing (specific to your surgery type) to the hospital.  Plan for stool softeners for home use; pain medications have a tendency to cause constipation. You can also help prevent constipation by eating foods high in fiber such as fruits and vegetables and drinking plenty of fluids as your diet allows.  After surgery,  you can help prevent lung complications by doing breathing exercises.  Take deep breaths and cough every 1-2 hours. Your doctor may order a device called an Incentive Spirometer to help you take deep breaths.  If you are being discharged the day of surgery, you will not be allowed to drive home. You will need a responsible adult (18 years or older) to drive you home and stay with you that night.   If you are taking public  transportation, you will need to have a responsible adult (18 years or older) with you. Please confirm with your physician that it is acceptable to use public transportation.   Please call the [place] at [phone] if you have any questions about these instructions.  Surgery Visitation Policy:  Patients undergoing a surgery or procedure may have one family member or support person with them as long as that person is not COVID-19 positive or experiencing its symptoms.  That person may remain in the waiting area during the procedure.

## 2021-04-19 ENCOUNTER — Ambulatory Visit
Admission: RE | Admit: 2021-04-19 | Discharge: 2021-04-19 | Disposition: A | Discharge status: Home / Self Care | Payer: BC Managed Care – PPO | Attending: Orthopedic Surgery | Admitting: Orthopedic Surgery

## 2021-04-19 ENCOUNTER — Encounter: Payer: Self-pay | Admitting: Orthopedic Surgery

## 2021-04-19 ENCOUNTER — Ambulatory Visit: Payer: BC Managed Care – PPO

## 2021-04-19 ENCOUNTER — Ambulatory Visit: Payer: BC Managed Care – PPO | Admitting: Anesthesiology

## 2021-04-19 ENCOUNTER — Other Ambulatory Visit: Payer: Self-pay

## 2021-04-19 ENCOUNTER — Encounter
Admission: RE | Disposition: A | Discharge status: Home / Self Care | Payer: Self-pay | Source: Home / Self Care | Attending: Orthopedic Surgery

## 2021-04-19 DIAGNOSIS — Z79899 Other long term (current) drug therapy: Secondary | ICD-10-CM | POA: Insufficient documentation

## 2021-04-19 DIAGNOSIS — Z85828 Personal history of other malignant neoplasm of skin: Secondary | ICD-10-CM | POA: Diagnosis not present

## 2021-04-19 DIAGNOSIS — M1811 Unilateral primary osteoarthritis of first carpometacarpal joint, right hand: Secondary | ICD-10-CM | POA: Diagnosis present

## 2021-04-19 DIAGNOSIS — Z885 Allergy status to narcotic agent status: Secondary | ICD-10-CM | POA: Insufficient documentation

## 2021-04-19 HISTORY — PX: CARPOMETACARPAL (CMC) FUSION OF THUMB: SHX6290

## 2021-04-19 SURGERY — CARPOMETACARPAL (CMC) FUSION OF THUMB
Anesthesia: General | Site: Thumb | Laterality: Right

## 2021-04-19 MED ORDER — FENTANYL CITRATE (PF) 100 MCG/2ML IJ SOLN
INTRAMUSCULAR | Status: DC | PRN
  Administered 2021-04-19: 25 ug via INTRAVENOUS
  Administered 2021-04-19: 50 ug via INTRAVENOUS
  Administered 2021-04-19: 25 ug via INTRAVENOUS

## 2021-04-19 MED ORDER — ONDANSETRON HCL 4 MG PO TABS: 4.0000 mg | ORAL_TABLET | Freq: Four times a day (QID) | ORAL | Status: DC | PRN

## 2021-04-19 MED ORDER — FAMOTIDINE 20 MG PO TABS
20.0000 mg | ORAL_TABLET | Freq: Once | ORAL | Status: AC
  Administered 2021-04-19: 20 mg via ORAL

## 2021-04-19 MED ORDER — CHLORHEXIDINE GLUCONATE 0.12 % MT SOLN
15.0000 mL | Freq: Once | OROMUCOSAL | Status: AC
  Administered 2021-04-19: 15 mL via OROMUCOSAL

## 2021-04-19 MED ORDER — OXYCODONE-ACETAMINOPHEN 5-325 MG PO TABS: 1.0000 | ORAL_TABLET | ORAL | 0 refills | Status: AC | PRN

## 2021-04-19 MED ORDER — PROPOFOL 10 MG/ML IV BOLUS
INTRAVENOUS | Status: DC | PRN
  Administered 2021-04-19: 150 mg via INTRAVENOUS
  Administered 2021-04-19: 20 mg via INTRAVENOUS

## 2021-04-19 MED ORDER — BUPIVACAINE HCL (PF) 0.5 % IJ SOLN
INTRAMUSCULAR | Status: DC | PRN
  Administered 2021-04-19: 10 mL

## 2021-04-19 MED ORDER — METOCLOPRAMIDE HCL 5 MG/ML IJ SOLN: 5.0000 mg | Freq: Three times a day (TID) | INTRAMUSCULAR | Status: DC | PRN

## 2021-04-19 MED ORDER — PROPOFOL 10 MG/ML IV BOLUS
INTRAVENOUS | Status: AC
  Filled 2021-04-19: qty 40

## 2021-04-19 MED ORDER — OXYCODONE HCL 5 MG PO TABS
ORAL_TABLET | ORAL | Status: AC
  Filled 2021-04-19: qty 1

## 2021-04-19 MED ORDER — ONDANSETRON HCL 4 MG/2ML IJ SOLN
INTRAMUSCULAR | Status: AC
  Filled 2021-04-19: qty 2

## 2021-04-19 MED ORDER — LACTATED RINGERS IV SOLN: INTRAVENOUS | Status: DC

## 2021-04-19 MED ORDER — METOCLOPRAMIDE HCL 10 MG PO TABS: 5.0000 mg | ORAL_TABLET | Freq: Three times a day (TID) | ORAL | Status: DC | PRN

## 2021-04-19 MED ORDER — OXYCODONE HCL 5 MG PO TABS
5.0000 mg | ORAL_TABLET | Freq: Once | ORAL | Status: AC | PRN
Start: 2021-04-19 — End: 2021-04-19

## 2021-04-19 MED ORDER — MIDAZOLAM HCL 2 MG/2ML IJ SOLN
INTRAMUSCULAR | Status: DC | PRN
  Administered 2021-04-19: 2 mg via INTRAVENOUS

## 2021-04-19 MED ORDER — NEOMYCIN-POLYMYXIN B GU 40-200000 IR SOLN
Status: AC
  Filled 2021-04-19: qty 2

## 2021-04-19 MED ORDER — DEXAMETHASONE SODIUM PHOSPHATE 10 MG/ML IJ SOLN
INTRAMUSCULAR | Status: AC
  Filled 2021-04-19: qty 1

## 2021-04-19 MED ORDER — MIDAZOLAM HCL 2 MG/2ML IJ SOLN
INTRAMUSCULAR | Status: AC
  Filled 2021-04-19: qty 2

## 2021-04-19 MED ORDER — ACETAMINOPHEN 10 MG/ML IV SOLN
INTRAVENOUS | Status: DC | PRN
  Administered 2021-04-19: 1000 mg via INTRAVENOUS

## 2021-04-19 MED ORDER — GELATIN ABSORBABLE 12-7 MM EX MISC
CUTANEOUS | Status: DC | PRN
  Administered 2021-04-19: 1

## 2021-04-19 MED ORDER — CHLORHEXIDINE GLUCONATE 0.12 % MT SOLN
OROMUCOSAL | Status: AC
  Filled 2021-04-19: qty 15

## 2021-04-19 MED ORDER — NEOMYCIN-POLYMYXIN B GU 40-200000 IR SOLN
Status: DC | PRN
  Administered 2021-04-19: 2 mL

## 2021-04-19 MED ORDER — SODIUM CHLORIDE 0.9 % IV SOLN: INTRAVENOUS | Status: DC

## 2021-04-19 MED ORDER — FENTANYL CITRATE (PF) 100 MCG/2ML IJ SOLN
25.0000 ug | INTRAMUSCULAR | Status: DC | PRN
  Administered 2021-04-19: 25 ug via INTRAVENOUS
  Administered 2021-04-19: 50 ug via INTRAVENOUS

## 2021-04-19 MED ORDER — LIDOCAINE HCL (CARDIAC) PF 100 MG/5ML IV SOSY
PREFILLED_SYRINGE | INTRAVENOUS | Status: DC | PRN
  Administered 2021-04-19: 80 mg via INTRAVENOUS

## 2021-04-19 MED ORDER — FAMOTIDINE 20 MG PO TABS
ORAL_TABLET | ORAL | Status: AC
  Filled 2021-04-19: qty 1

## 2021-04-19 MED ORDER — ONDANSETRON HCL 4 MG/2ML IJ SOLN
INTRAMUSCULAR | Status: DC | PRN
  Administered 2021-04-19: 4 mg via INTRAVENOUS

## 2021-04-19 MED ORDER — DEXAMETHASONE SODIUM PHOSPHATE 10 MG/ML IJ SOLN
INTRAMUSCULAR | Status: DC | PRN
  Administered 2021-04-19: 10 mg via INTRAVENOUS

## 2021-04-19 MED ORDER — ORAL CARE MOUTH RINSE: 15.0000 mL | Freq: Once | OROMUCOSAL | Status: AC

## 2021-04-19 MED ORDER — ONDANSETRON HCL 4 MG/2ML IJ SOLN: 4.0000 mg | Freq: Four times a day (QID) | INTRAMUSCULAR | Status: DC | PRN

## 2021-04-19 MED ORDER — OXYCODONE HCL 5 MG/5ML PO SOLN
5.0000 mg | Freq: Once | ORAL | Status: AC | PRN
  Administered 2021-04-19: 5 mg via ORAL

## 2021-04-19 MED ORDER — FENTANYL CITRATE (PF) 100 MCG/2ML IJ SOLN
INTRAMUSCULAR | Status: AC
  Filled 2021-04-19: qty 2

## 2021-04-19 MED ORDER — METOCLOPRAMIDE HCL 5 MG/ML IJ SOLN
INTRAMUSCULAR | Status: AC
  Filled 2021-04-19: qty 2

## 2021-04-19 MED ORDER — GELATIN ABSORBABLE 12-7 MM EX MISC
CUTANEOUS | Status: AC
  Filled 2021-04-19: qty 1

## 2021-04-19 MED ORDER — MIDAZOLAM HCL 2 MG/2ML IJ SOLN
INTRAMUSCULAR | Status: AC
  Administered 2021-04-19: 1 mg via INTRAVENOUS
  Filled 2021-04-19: qty 2

## 2021-04-19 MED ORDER — ACETAMINOPHEN 10 MG/ML IV SOLN
INTRAVENOUS | Status: AC
  Filled 2021-04-19: qty 100

## 2021-04-19 MED ORDER — ONDANSETRON HCL 4 MG/2ML IJ SOLN
INTRAMUSCULAR | Status: AC
  Administered 2021-04-19: 4 mg via INTRAVENOUS
  Filled 2021-04-19: qty 2

## 2021-04-19 MED ORDER — BUPIVACAINE HCL (PF) 0.5 % IJ SOLN
INTRAMUSCULAR | Status: AC
  Filled 2021-04-19: qty 30

## 2021-04-19 MED ORDER — MIDAZOLAM HCL 2 MG/2ML IJ SOLN: 1.0000 mg | Freq: Once | INTRAMUSCULAR | Status: AC

## 2021-04-19 MED ORDER — LIDOCAINE HCL (PF) 2 % IJ SOLN
INTRAMUSCULAR | Status: AC
  Filled 2021-04-19: qty 5

## 2021-04-19 MED ORDER — CEFAZOLIN SODIUM-DEXTROSE 2-4 GM/100ML-% IV SOLN
INTRAVENOUS | Status: AC
  Filled 2021-04-19: qty 100

## 2021-04-19 MED ORDER — CEFAZOLIN SODIUM-DEXTROSE 2-4 GM/100ML-% IV SOLN
2.0000 g | INTRAVENOUS | Status: AC
  Administered 2021-04-19: 2 g via INTRAVENOUS

## 2021-04-19 MED ORDER — METOCLOPRAMIDE HCL 5 MG/ML IJ SOLN
INTRAMUSCULAR | Status: AC
  Administered 2021-04-19: 10 mg via INTRAVENOUS
  Filled 2021-04-19: qty 2

## 2021-04-19 MED ORDER — FENTANYL CITRATE (PF) 100 MCG/2ML IJ SOLN
INTRAMUSCULAR | Status: AC
  Administered 2021-04-19: 25 ug via INTRAVENOUS
  Filled 2021-04-19: qty 2

## 2021-04-19 MED ORDER — PROPOFOL 10 MG/ML IV BOLUS
INTRAVENOUS | Status: AC
  Filled 2021-04-19: qty 20

## 2021-04-19 SURGICAL SUPPLY — 36 items
APL PRP STRL LF DISP 70% ISPRP (MISCELLANEOUS) ×1
BLADE OSC/SAGITTAL 5.5X25 (BLADE) ×3 IMPLANT
BNDG CMPR STD VLCR NS LF 5.8X3 (GAUZE/BANDAGES/DRESSINGS) ×1
BNDG CONFORM 3 STRL LF (GAUZE/BANDAGES/DRESSINGS) ×3 IMPLANT
BNDG ELASTIC 3X5.8 VLCR NS LF (GAUZE/BANDAGES/DRESSINGS) ×3 IMPLANT
CAST PADDING 3X4FT ST 30246 (SOFTGOODS) ×2
CHLORAPREP W/TINT 26 (MISCELLANEOUS) ×3 IMPLANT
COVER WAND RF STERILE (DRAPES) ×3 IMPLANT
CUFF TOURN SGL QUICK 18X4 (TOURNIQUET CUFF) ×3 IMPLANT
DRAPE FLUOR MINI C-ARM 54X84 (DRAPES) ×3 IMPLANT
ELECT CAUTERY BLADE 6.4 (BLADE) ×3 IMPLANT
GAUZE SPONGE 4X4 12PLY STRL (GAUZE/BANDAGES/DRESSINGS) ×3 IMPLANT
GAUZE XEROFORM 1X8 LF (GAUZE/BANDAGES/DRESSINGS) ×3 IMPLANT
GLOVE SURG SYN 9.0  PF PI (GLOVE) ×6
GLOVE SURG SYN 9.0 PF PI (GLOVE) ×3 IMPLANT
GOWN SRG 2XL LVL 4 RGLN SLV (GOWNS) ×1 IMPLANT
GOWN STRL NON-REIN 2XL LVL4 (GOWNS) ×3
GOWN STRL REUS W/ TWL LRG LVL3 (GOWN DISPOSABLE) ×1 IMPLANT
GOWN STRL REUS W/TWL LRG LVL3 (GOWN DISPOSABLE) ×3
KIT TURNOVER KIT A (KITS) ×3 IMPLANT
MANIFOLD NEPTUNE II (INSTRUMENTS) ×3 IMPLANT
NS IRRIG 500ML POUR BTL (IV SOLUTION) ×3 IMPLANT
PACK EXTREMITY ARMC (MISCELLANEOUS) ×3 IMPLANT
PAD CAST CTTN 3X4 STRL (SOFTGOODS) ×1 IMPLANT
PADDING CAST COTTON 3X4 STRL (SOFTGOODS) ×1
SCALPEL PROTECTED #15 DISP (BLADE) ×6 IMPLANT
SPLINT CAST 1 STEP 3X12 (MISCELLANEOUS) ×3 IMPLANT
SPLINT WRIST M LT TX990308 (SOFTGOODS) ×3 IMPLANT
SUT ETHILON 4-0 (SUTURE) ×3
SUT ETHILON 4-0 FS2 18XMFL BLK (SUTURE) ×1
SUT VIC AB 0 CT2 27 (SUTURE) ×6 IMPLANT
SUT VIC AB 3-0 SH 27 (SUTURE) ×3
SUT VIC AB 3-0 SH 27X BRD (SUTURE) ×1 IMPLANT
SUTURE ETHLN 4-0 FS2 18XMF BLK (SUTURE) ×1 IMPLANT
SYSTEM IMPLANT TIGHTROPE MINI (Anchor) ×3 IMPLANT
WIRE Z .062 C-WIRE SPADE TIP (WIRE) ×6 IMPLANT

## 2021-04-19 NOTE — Discharge Instructions (Addendum)
Keep arm elevated is much as possible the next 2 to 3 days. Do not try to move the thumb but okay to work the other fingers. Ice to the back of the hand may help with pain today and tomorrow Pain medicine as directed Call office if you are having problems  AMBULATORY SURGERY  DISCHARGE INSTRUCTIONS   The drugs that you were given will stay in your system until tomorrow so for the next 24 hours you should not:  Drive an automobile Make any legal decisions Drink any alcoholic beverage   You may resume regular meals tomorrow.  Today it is better to start with liquids and gradually work up to solid foods.  You may eat anything you prefer, but it is better to start with liquids, then soup and crackers, and gradually work up to solid foods.   Please notify your doctor immediately if you have any unusual bleeding, trouble breathing, redness and pain at the surgery site, drainage, fever, or pain not relieved by medication.    Additional Instructions:        Please contact your physician with any problems or Same Day Surgery at [phone], Monday through Friday 6 am to 4 pm, or [place] at [facility] number at [phone].

## 2021-04-19 NOTE — Op Note (Signed)
04/19/2021  3:23 PM  PATIENT:  Michelle Franco  50 y.o. 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  PROCEDURE:  Procedure(s): CARPOMETACARPAL (CMC) FUSION OF THUMB (Right)  SURGEON: Laurene Footman, MD  ASSISTANTS: None  ANESTHESIA:   general  EBL:  Total I/O In: 1800 [I.V.:1600; IV Piggyback:200] Out: 0   BLOOD ADMINISTERED:none  DRAINS: none   LOCAL MEDICATIONS USED:  MARCAINE     SPECIMEN:  No Specimen  DISPOSITION OF SPECIMEN:  N/A  COUNTS:  YES  TOURNIQUET:   Total Tourniquet Time Documented: Upper Arm (Right) - 39 minutes Total: Upper Arm (Right) - 39 minutes   IMPLANTS: Mini tight rope x1  DICTATION: .Dragon Dictation patient was brought to the operating room and after adequate general anesthesia was obtained the right arm was prepped and draped in the usual sterile fashion.  After patient identification and timeout procedures were completed incision was made along the interval between volar and dorsal skin centered over the base of the first metacarpal to the level of the scaphoid.  Skin and subcutaneous tissue were spread preserving superficial branch of the radial nerve.  The [facility] joint was opened and the capsule elevated for subsequent repair.  The trapezium was identified proximally and distally and separated from capsule dorsally and volarly as much as possible and then sectioned into quarters cut with an osteotome and then taken out as quarters.  After removal of the trapezium the scaphoid is noted to be normal in appearance and the second MCP carpal joint appear normal.  A guidewire was then inserted through the base of the first metacarpal into the base of the second and pulled out through the dorsum of the skin with a stitch mini tight rope anchor being passed then the on the second metacarpal side  a second anchor on that side tightened down with the thumb abducted without excessive tension.  The thumb did not piston following this and the position appeared appropriate.  The remaining sutures were tied and the suture cut short with the implant buried the ulnar side of the second metacarpal.  Next the area of the trapezium was filled with Gelfoam for subsequent scar formation the capsule was repaired using a #1 Vicryl 3-0 Vicryl subcutaneously 4-0 nylon in a running manner for the skin and simple interrupted at the second metacarpal.  10 cc of half percent Sensorcaine were infiltrated in the area of the incision and the wounds dressed with Xeroform 4 x 4 web roll and a thumb spica splint  PLAN OF CARE: Discharge to home after PACU  PATIENT DISPOSITION:  PACU - hemodynamically stable.

## 2021-04-19 NOTE — H&P (Signed)
Chief Complaint  Patient presents with   Pre-op Exam  Right thumb CMC arthroplasty scheduled 04/19/21 with Dr. Rudene Christians    History of the Present Illness: Michelle Franco is a 50 y.o. female here today for history and physical for right thumb [facility] arthroplasty with Dr. Hessie Knows on 04/19/2021. Patient's had advanced CMC osteoarthritis for years. Recently pain has become severe and constant. Despite bracing, injection she is had no relief. Pain is moderate to severe. Patient is seen Dr. Rudene Christians, discussed [facility] arthroplasty and has agreed and consented procedure.  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   I have reviewed past medical, surgical, social and family history, and allergies as documented in the EMR.  Past Medical History: Past Medical History:  Diagnosis Date   Allergy   Anxiety   Depression   GERD (gastroesophageal reflux disease)   Headache, unspecified   Hemorrhoids   Hyperlipidemia   Menstrual problem, unspecified   Postmenopausal bleeding   Past Surgical History: Past Surgical History:  Procedure Laterality Date   [place] 2002   COLONOSCOPY 12/05/2011  Normal - Dr. France Ravens at [place] 10/22/2020  Fx D&C hysteroscopy, failed novasure   EGD 12/05/2011  Erosion at [place] and [facility]. Neg for H Pylori. Dr. France Ravens at [facility]   Fx D&C and hysteroscopy 10/22/2020   TONSILLECTOMY   TVT 12/2011  Dr Earna Coder   Past Family History: Family History  Problem Relation Age of Onset   Hyperlipidemia (Elevated cholesterol) Mother   Berenice Primas' disease Mother   Diabetes type II Father   Hyperlipidemia (Elevated cholesterol) Father   Thyroid disease Father   Thyroid cancer Father   Melanoma Father   Diabetes type II  Brother   ADD / ADHD Brother   Alcohol abuse Maternal Grandmother   Mental illness Maternal Grandmother   No Known Problems Maternal Grandfather   Breast cancer Paternal Grandmother 62   Dementia Paternal Grandfather   Ehlers-Danlos syndrome Daughter   Anxiety Daughter   No Known Problems Son   No Known Problems Son   Medications: Current Outpatient Medications Ordered in Epic  Medication Sig Dispense Refill   ALPRAZolam (XANAX) 0.5 MG tablet Take 0.5 mg by mouth nightly as needed for Sleep   amoxicillin (AMOXIL) 500 MG tablet Take 500 mg by mouth 3 (three) times a day   atorvastatin (LIPITOR) 20 MG tablet Take 1 tablet (20 mg total) by mouth once daily 90 tablet 3   busPIRone (BUSPAR) 7.5 MG tablet Take 1 tablet (7.5 mg total) by mouth 2 (two) times daily 60 tablet 2   cetirizine (ZYRTEC) 10 MG tablet Take 10 mg by mouth once daily   DULoxetine (CYMBALTA) 60 MG DR capsule Take 1 capsule (60 mg total) by mouth once daily 90 capsule 3   fluticasone propionate (FLONASE) 50 mcg/actuation nasal spray Place 2 sprays into both nostrils once daily   linaCLOtide (LINZESS) 290 mcg capsule Take 1 capsule (290 mcg total) by mouth once daily 30 capsule 11   methylcellulose (CITRUCEL ORAL) Take by mouth once daily   multivitamin capsule Take 1 capsule by mouth once daily   traZODone (DESYREL) 50 MG tablet Take 1 tablet (50 mg total) by mouth nightly 90 tablet 3   No current Epic-ordered facility-administered medications on  file.   Allergies: Allergies  Allergen Reactions   Codeine Headache    Body mass index is 20.22 kg/m.  Review of Systems: A comprehensive 14 point ROS was performed, reviewed, and the pertinent orthopaedic findings are documented in the HPI.  Vitals:  04/05/21 1555  BP: 118/62    General Physical Examination:   General:  Well developed, well nourished, no apparent distress, normal affect, normal gait with no antalgic component.   HEENT: Head normocephalic,  atraumatic, PERRL.   Abdomen: Soft, non tender, non distended, Bowel sounds present.  Heart: Examination of the heart reveals regular, rate, and rhythm. There is no murmur noted on ascultation. There is a normal apical pulse.  Lungs: Lungs are clear to auscultation. There is no wheeze, rhonchi, or crackles. There is normal expansion of bilateral chest walls.   Musculoskeletal Examination:  On exam, tenderness to the right thumb CMC joint. Loss of right thumb abduction. Pain with right thumb extension.   Radiographs:  X-rays of right thumb reviewed by me today from 02/22/2020 show severe degenerative changes of the first [facility] joint with complete loss of joint space and subluxation.  Assessment: ICD-10-CM  1. 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 50 year old female with severe right thumb CMC osteoarthritis. She is failed conservative treatment. Pain is interfering quality of life and activities daily living. Risks, benefits, complications of a right thumb CMC arthroplasty have been discussed with the patient. Patient has agreed and consented procedure with Dr. Hessie Knows on 04/19/2021    Electronically signed by Feliberto Gottron, [place] at 04/05/2021 4:48 PM EDT Reviewed  H+P. No changes noted.

## 2021-04-19 NOTE — Anesthesia Procedure Notes (Signed)
Procedure Name: LMA Insertion Date/Time: 04/19/2021 2:26 PM Performed by: Johnna Acosta, CRNA Pre-anesthesia Checklist: Patient identified, Emergency Drugs available, Suction available, Patient being monitored and Timeout performed Patient Re-evaluated:Patient Re-evaluated prior to induction Oxygen Delivery Method: Circle system utilized Preoxygenation: Pre-oxygenation with 100% oxygen Induction Type: IV induction LMA: LMA inserted LMA Size: 3.5 Tube type: Oral Number of attempts: 1 Airway Equipment and Method: Oral airway Placement Confirmation: positive ETCO2 and breath sounds checked- equal and bilateral Tube secured with: Tape Dental Injury: Teeth and Oropharynx as per pre-operative assessment

## 2021-04-19 NOTE — Anesthesia Preprocedure Evaluation (Signed)
Anesthesia Evaluation  Patient identified by MRN, date of birth, ID band Patient awake    Reviewed: Allergy & Precautions, NPO status , Patient's Chart, lab work & pertinent test results  History of Anesthesia Complications (+) Family history of anesthesia reaction and history of anesthetic complications  Airway Mallampati: II  TM Distance: >3 FB Neck ROM: full    Dental  (+) Chipped   Pulmonary neg pulmonary ROS, neg shortness of breath,    Pulmonary exam normal        Cardiovascular Exercise Tolerance: Good (-) angina(-) Past MI and (-) DOE negative cardio ROS Normal cardiovascular exam     Neuro/Psych PSYCHIATRIC DISORDERS negative neurological ROS     GI/Hepatic Neg liver ROS, GERD  Medicated and Controlled,  Endo/Other  negative endocrine ROS  Renal/GU      Musculoskeletal  (+) Arthritis ,   Abdominal   Peds  Hematology negative hematology ROS (+)   Anesthesia Other Findings Past Medical History: No date: Anxiety 02/2021: Basal cell carcinoma (BCC) of upper lip     Comment:  left No date: Depression No date: Family history of adverse reaction to anesthesia     Comment:  father very sleepy after No date: GERD (gastroesophageal reflux disease) No date: Hemorrhoids No date: Hyperlipidemia  Past Surgical History: 1987: APPENDECTOMY 1999, 2002: CESAREAN SECTION     Comment:  x2 2013: COLONOSCOPY WITH ESOPHAGOGASTRODUODENOSCOPY (EGD) 10/22/2020: DILITATION & CURRETTAGE/HYSTROSCOPY WITH NOVASURE  ABLATION; N/A     Comment:  Procedure: Fractional Dilitation and Curattage Aborted               Novasure due to cavity width;  Surgeon: Schermerhorn,               Gwen Her, MD;  Location: [facility] ORS;  Service: Gynecology;               Laterality: N/A; 2003: HEMORROIDECTOMY 2010: INCONTINENCE SURGERY     Comment:  TVT sling 03/02/2021: MOHS SURGERY; Left     Comment:  upper lip 1986: TONSILLECTOMY  BMI     Body Mass Index: 20.53 kg/m      Reproductive/Obstetrics negative OB ROS                             Anesthesia Physical Anesthesia Plan  ASA: 2  Anesthesia Plan: General LMA   Post-op Pain Management:    Induction: Intravenous  PONV Risk Score and Plan: Dexamethasone, Ondansetron, Midazolam and Treatment may vary due to age or medical condition  Airway Management Planned: LMA  Additional Equipment:   Intra-op Plan:   Post-operative Plan: Extubation in OR  Informed Consent: I have reviewed the patients History and Physical, chart, labs and discussed the procedure including the risks, benefits and alternatives for the proposed anesthesia with the patient or authorized representative who has indicated his/her understanding and acceptance.     Dental Advisory Given  Plan Discussed with: Anesthesiologist, CRNA and Surgeon  Anesthesia Plan Comments: (Patient consented for risks of anesthesia including but not limited to:  - adverse reactions to medications - damage to eyes, teeth, lips or other oral mucosa - nerve damage due to positioning  - sore throat or hoarseness - Damage to heart, brain, nerves, lungs, other parts of body or loss of life  Patient voiced understanding.)        Anesthesia Quick Evaluation

## 2021-04-19 NOTE — Transfer of Care (Signed)
Immediate Anesthesia Transfer of Care Note  Patient: Michelle Franco  Procedure(s) Performed: CARPOMETACARPAL ([place]) FUSION OF THUMB (Right: Thumb)  Patient Location: PACU  Anesthesia Type:General  Level of Consciousness: awake and alert   Airway & Oxygen Therapy: Patient Spontanous Breathing and Patient connected to face mask oxygen  Post-op Assessment: Report given to RN and Post -op Vital signs reviewed and stable  Post vital signs: Reviewed and stable  Last Vitals:  Vitals Value Taken Time  BP 98/85 04/19/21 1528  Temp 36.2 C 04/19/21 1528  Pulse 103 04/19/21 1530  Resp 16 04/19/21 1530  SpO2 99 % 04/19/21 1530  Vitals shown include unvalidated device data.  Last Pain:  Vitals:   04/19/21 1129  TempSrc: Oral  PainSc: 4          Complications: No notable events documented.

## 2021-04-20 ENCOUNTER — Encounter: Payer: Self-pay | Admitting: Orthopedic Surgery

## 2021-04-20 NOTE — Anesthesia Postprocedure Evaluation (Signed)
Anesthesia Post Note  Patient: Michelle Franco  Procedure(s) Performed: CARPOMETACARPAL ([place]) FUSION OF THUMB (Right: Thumb)  Patient location during evaluation: PACU Anesthesia Type: General Level of consciousness: awake and alert Pain management: pain level controlled Vital Signs Assessment: post-procedure vital signs reviewed and stable Respiratory status: spontaneous breathing, nonlabored ventilation, respiratory function stable and patient connected to nasal cannula oxygen Cardiovascular status: blood pressure returned to baseline and stable Postop Assessment: no apparent nausea or vomiting Anesthetic complications: no   No notable events documented.   Last Vitals:  Vitals:   04/19/21 1618 04/19/21 1646  BP: 133/80 121/72  Pulse: 73 85  Resp: 18   Temp: 36.4 C   SpO2: 100% 98%    Last Pain:  Vitals:   04/19/21 1646  TempSrc:   PainSc: 6                  Martha Clan

## 2021-04-20 NOTE — Addendum Note (Signed)
Addendum  created 04/20/21 0040 by Martha Clan, MD   Attestation recorded in [place], [place] filed

## 2021-04-25 ENCOUNTER — Other Ambulatory Visit: Payer: Commercial Managed Care - PPO

## 2021-05-10 ENCOUNTER — Encounter: Payer: Self-pay | Admitting: Occupational Therapy

## 2021-05-10 ENCOUNTER — Ambulatory Visit: Payer: BC Managed Care – PPO | Attending: Orthopedic Surgery | Admitting: Occupational Therapy

## 2021-05-10 DIAGNOSIS — M25631 Stiffness of right wrist, not elsewhere classified: Secondary | ICD-10-CM

## 2021-05-10 DIAGNOSIS — L905 Scar conditions and fibrosis of skin: Secondary | ICD-10-CM | POA: Diagnosis present

## 2021-05-10 DIAGNOSIS — M25641 Stiffness of right hand, not elsewhere classified: Secondary | ICD-10-CM | POA: Diagnosis not present

## 2021-05-10 DIAGNOSIS — M79641 Pain in right hand: Secondary | ICD-10-CM

## 2021-05-10 DIAGNOSIS — M6281 Muscle weakness (generalized): Secondary | ICD-10-CM | POA: Diagnosis present

## 2021-05-10 NOTE — Therapy (Signed)
[facility] [address], [place], [address] Phone: [phone]   Fax:  [phone]  Occupational Therapy Evaluation  Patient Details  Name: Michelle Franco MRN: [id number] Date of Birth: [birth year] Referring Provider (OT): Rachelle Hora   Encounter Date: 05/10/2021   OT End of Session - 05/10/21 1520     Visit Number 1    Number of Visits 16    Date for OT Re-Evaluation 07/05/21    OT Start Time 1525    OT Stop Time 2202    OT Time Calculation (min) 40 min    Activity Tolerance Patient tolerated treatment well    Behavior During Therapy [facility] for tasks assessed/performed             Past Medical History:  Diagnosis Date   Anxiety    Basal cell carcinoma (BCC) of upper lip 02/2021   left   Depression    Family history of adverse reaction to anesthesia    father very sleepy after   GERD (gastroesophageal reflux disease)    Hemorrhoids    Hyperlipidemia     Past Surgical History:  Procedure Laterality Date   APPENDECTOMY  1987   CARPOMETACARPAL ([place]) FUSION OF THUMB Right 04/19/2021   Procedure: CARPOMETACARPAL [facility]) FUSION OF THUMB;  Surgeon: Hessie Knows, MD;  Location: [facility] ORS;  Service: Orthopedics;  Laterality: Right;   [place], 2002   x2   COLONOSCOPY WITH ESOPHAGOGASTRODUODENOSCOPY (EGD)  2013   DILITATION & CURRETTAGE/HYSTROSCOPY WITH NOVASURE ABLATION N/A 10/22/2020   Procedure: Fractional Dilitation and Curattage Aborted Novasure due to cavity width;  Surgeon: Schermerhorn, Gwen Her, MD;  Location: [facility] ORS;  Service: Gynecology;  Laterality: N/A;   HEMORROIDECTOMY  2003   INCONTINENCE SURGERY  2010   TVT sling   MOHS SURGERY Left 03/02/2021   upper lip   TONSILLECTOMY  1986    There were no vitals filed for this visit.   Subjective Assessment - 05/10/21 1519     Subjective  I had the brace off yesterday and today all day -and have more swelling and pain - over  there at the thumb - base of thumb -scar tender and still little bit of numbness    Pertinent History Pt had R CMC arthroplasty done by Dr Rudene Christians on 04/19/21 and followed up with PA - had stitches out at 04/28/21 and cont with thumb spica until 3 wks s/p - refer to OT for  hand therapy    Patient Stated Goals I want to be able to go back to work end of month , kayak, read , write cards and do things around the house    Currently in Pain? Yes    Pain Score 6    by the end of the day out of splint   Pain Location Hand    Pain Orientation Right    Pain Descriptors / Indicators Aching;Tightness;Tender    Pain Type Surgical pain    Pain Onset More than a month ago    Pain Frequency Intermittent               [facility] OT Assessment - 05/10/21 0001       Assessment   Medical Diagnosis R CMC arthroplasty    Referring Provider (OT) Rachelle Hora    Onset Date/Surgical Date 04/19/21    Hand Dominance Right    Next MD Visit end of July      Precautions  Required Braces or Orthoses --   THumb spica     Home  Environment   Lives With Spouse      Prior Function   Vocation Full time employment    Leisure teacher special ed- read, kayak, writing, typing , workout      AROM   Right Wrist Extension 60 Degrees    Right Wrist Flexion 84 Degrees    Right Wrist Radial Deviation 15 Degrees    Right Wrist Ulnar Deviation 30 Degrees    Left Wrist Extension 75 Degrees    Left Wrist Flexion 94 Degrees    Left Wrist Radial Deviation 22 Degrees    Left Wrist Ulnar Deviation 35 Degrees      Right Hand AROM   R Thumb MCP 0-60 65 Degrees    R Thumb IP 0-80 35 Degrees    R Thumb Radial ABduction/ADduction 0-55 40   pain base of CMC   R Thumb Palmar ABduction/ADduction 0-45 60    R Thumb Opposition to Index --   Opposition to all DIP's - stabilizing CMC               Fluidotherapy done  - pt to do contrast at home  2-3 x day  - prior to review of HEP - decrease pain and increase AROM in R  wrist and hand Review with pt HEP for thumb AROM pain free -PA and RA of thumb  Circular AROM both ways for thumb  IP thumb PROM flexion  And opposition - Wrist AAROM for flexion, ext, RD, UD  10r eps Wean gradually out of splint  Ice massage over 1st dorsal compartment - tender ? tendinitis             OT Education - 05/10/21 1519     Education Details findings of eval and HEP    Person(s) Educated Patient    Methods Explanation;Demonstration;Tactile cues;Verbal cues;Handout    Comprehension Verbal cues required;Returned demonstration;Verbalized understanding              OT Short Term Goals - 05/10/21 1752       OT SHORT TERM GOAL #1   Title Pt to be independent in HEP to decease pain and increase AROM in R thumb and wrist to [facility] with pain less than 2/10 with use    Baseline pain incease 6-7/10 with use and out of splint-  decrease thumb and wrist AROM    Time 3    Period Weeks    Status New    Target Date 05/31/21               OT Long Term Goals - 05/10/21 1753       OT LONG TERM GOAL #1   Title R thumb strength increase for pt to do opposition and PA/RA without increase symptoms to hold cup , turn doorknob and buttons    Baseline pain 6-7/10 wiht use -and decrease pain and edema- pain end range RA of thumb    Time 6    Period Weeks    Status New    Target Date 06/21/21      OT LONG TERM GOAL #2   Title R wrist AROM and strength WNL to push and pull door, clean counter    Baseline decrease wrist AROM in all plans -and pain 6-7/10    Time 6    Period Weeks    Status New    Target Date 06/21/21  OT LONG TERM GOAL #3   Title R grip and prehension strength increase to more than 60% compare to L hand to cut food, kayak , write,hold book  without increase symptoms    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 Pt ed on gradually decrease time of wearing to 2hrs on and off , then 3 and then 4 off with 2 on - but keep pain under 2/10. Pt arrive with no splint and pain and edema inrease -decrease AROM and grip/prehensin strength in R dominant hand limiting her functional use of R hand in ADL's and IADL's  - pt ed on scar tissue and desentitization    OT Occupational Profile and History Problem Focused Assessment - Including review of records relating to presenting problem    Occupational performance deficits (Please refer to evaluation for details): ADL's;IADL's;Work;Play;Leisure;Social Participation    Body Structure / Function / Physical Skills ADL;Coordination;Flexibility;FMC;Scar mobility;ROM;IADL;Edema;Dexterity;Pain;Strength;Decreased knowledge of precautions    Rehab Potential Good    Clinical Decision Making Limited treatment options, no task modification necessary    Comorbidities Affecting Occupational Performance: None    Modification or Assistance to Complete Evaluation  No modification of tasks or assist necessary to complete eval    OT Frequency 2x / week    OT Duration 8 weeks    OT Treatment/Interventions Self-care/ADL training;Paraffin;Fluidtherapy;Contrast Bath;Therapeutic exercise;Manual Therapy;Patient/family education;Passive range of motion;Splinting;DME and/or AE instruction    Consulted and Agree with Plan of Care Patient             Patient will benefit from skilled therapeutic intervention in order to improve the following deficits and impairments:   Body Structure / Function / Physical Skills: ADL, Coordination, Flexibility, FMC, Scar mobility, ROM, IADL, Edema, Dexterity, Pain, Strength, Decreased knowledge of precautions       Visit Diagnosis: Stiffness of right hand, not elsewhere classified -  Plan: Ot plan of care cert/re-cert  Stiffness of right wrist, not elsewhere classified - Plan: Ot plan of care cert/re-cert  Scar condition and fibrosis of skin - Plan: Ot plan of care cert/re-cert  Pain in right hand - Plan: Ot plan of care cert/re-cert  Muscle weakness (generalized) - Plan: Ot plan of care cert/re-cert    Problem List Patient Active Problem List   Diagnosis Date Noted   PMB (postmenopausal bleeding) 05/14/2020   Generalized anxiety disorder 04/20/2020   Hyperlipidemia, mixed 04/20/2020   Irritable bowel syndrome with constipation 04/20/2020   Left elbow tendonitis 04/20/2020   Lateral epicondylitis of left elbow 02/19/2019   Mild depression ([place]) 05/29/2017   SUI (stress urinary incontinence, female) 12/28/2011    Rosalyn Gess OTR/L,CLT 05/10/2021, 6:00 PM  [facility] [address] [place], [address] Phone: [phone]   Fax:  [phone]  Name: Michelle Franco MRN: [id number] Date of Birth: 09/03/1971

## 2021-05-17 ENCOUNTER — Ambulatory Visit: Payer: BC Managed Care – PPO | Admitting: Occupational Therapy

## 2021-05-17 DIAGNOSIS — M25641 Stiffness of right hand, not elsewhere classified: Secondary | ICD-10-CM | POA: Diagnosis not present

## 2021-05-17 DIAGNOSIS — M6281 Muscle weakness (generalized): Secondary | ICD-10-CM

## 2021-05-17 DIAGNOSIS — M79641 Pain in right hand: Secondary | ICD-10-CM

## 2021-05-17 DIAGNOSIS — L905 Scar conditions and fibrosis of skin: Secondary | ICD-10-CM

## 2021-05-17 DIAGNOSIS — M25631 Stiffness of right wrist, not elsewhere classified: Secondary | ICD-10-CM

## 2021-05-17 NOTE — Therapy (Signed)
[facility] [address], [place], [address] Phone: [phone]   Fax:  [phone]  Occupational Therapy Treatment  Patient Details  Name: Michelle Franco MRN: [id number] Date of Birth: 12-Sep-1971 Referring Provider (OT): Rachelle Hora   Encounter Date: 05/17/2021   OT End of Session - 05/17/21 1602     Visit Number 2    Number of Visits 16    Date for OT Re-Evaluation 07/05/21    OT Start Time 1517    OT Stop Time 1601    OT Time Calculation (min) 44 min    Activity Tolerance Patient tolerated treatment well    Behavior During Therapy [facility] for tasks assessed/performed             Past Medical History:  Diagnosis Date   Anxiety    Basal cell carcinoma (BCC) of upper lip 02/2021   left   Depression    Family history of adverse reaction to anesthesia    father very sleepy after   GERD (gastroesophageal reflux disease)    Hemorrhoids    Hyperlipidemia     Past Surgical History:  Procedure Laterality Date   APPENDECTOMY  1987   CARPOMETACARPAL ([place]) FUSION OF THUMB Right 04/19/2021   Procedure: CARPOMETACARPAL [facility]) FUSION OF THUMB;  Surgeon: Hessie Knows, MD;  Location: [facility] ORS;  Service: Orthopedics;  Laterality: Right;   [place], 2002   x2   COLONOSCOPY WITH ESOPHAGOGASTRODUODENOSCOPY (EGD)  2013   DILITATION & CURRETTAGE/HYSTROSCOPY WITH NOVASURE ABLATION N/A 10/22/2020   Procedure: Fractional Dilitation and Curattage Aborted Novasure due to cavity width;  Surgeon: Schermerhorn, Gwen Her, MD;  Location: [facility] OR[facility];  Service: Gynecology;  Laterality: N/A;   HEMORROIDECTOMY  2003   INCONTINENCE SURGERY  2010   TVT sling   MOHS SURGERY Left 03/02/2021   upper lip   TONSILLECTOMY  1986    There were no vitals filed for this visit.   Subjective Assessment - 05/17/21 1554     Subjective  I am doing better- pain is better and left splint off today most of the day - and doing  light gripping and picking up - pain okay -but still swellling    Pertinent History Pt had R CMC arthroplasty done by Dr Rudene Christians on 04/19/21 and followed up with PA - had stitches out at 04/28/21 and cont with thumb spica until 3 wks s/p - refer to OT for  hand therapy    Patient Stated Goals I want to be able to go back to work end of month , kayak, read , write cards and do things around the house    Currently in Pain? No/denies                [facility] OT Assessment - 05/17/21 0001       AROM   Right Wrist Extension 65 Degrees    Right Wrist Flexion 90 Degrees      Right Hand AROM   R Thumb MCP 0-60 65 Degrees    R Thumb IP 0-80 40 Degrees    R Thumb Radial ABduction/ADduction 0-55 45    R Thumb Palmar ABduction/ADduction 0-45 60    R Thumb Opposition to Index --   Opposiition to all digits - stabilize base of CMC             Pt show decrease pain and increase AROM progressing well and out of splint this date again -  and doing well - l        OT Treatments/Exercises (OP) - 05/17/21 0001       RUE Fluidotherapy   Number Minutes Fluidotherapy 8 Minutes    RUE Fluidotherapy Location Hand;Wrist    Comments AROM for wrist and thumb in all planes to decrease pain            Fluidotherapy done  - pt to do contrast at home 2 x day increase AROM in R wrist and hand Review with pt HEP for thumb AROM pain free -PA and RA of thumb to cont with Circular AROM both ways for thumb IP thumb PROM flexion, add and one block MC flexion PROM pain free And opposition - to all digits- doing oval - 8 reps  Wrist AAROM for flexion, ext, RD, UD 10r eps Add and review from PA to RA composite - place and hold 10 reps  Wean gradually out of splint and can use CMC neoprene splint  Ed pt on using Penagain for writing at work to avoid tight pinch  Ice massage over 1st dorsal compartment - tender ? tendinitis            OT Education - 05/17/21 1602     Education Details progress  and changes to HEP    Person(s) Educated Patient    Methods Explanation;Demonstration;Tactile cues;Verbal cues;Handout    Comprehension Verbal cues required;Returned demonstration;Verbalized understanding              OT Short Term Goals - 05/10/21 1752       OT SHORT TERM GOAL #1   Title Pt to be independent in HEP to decease pain and increase AROM in R thumb and wrist to [facility] with pain less than 2/10 with use    Baseline pain incease 6-7/10 with use and out of splint-  decrease thumb and wrist AROM    Time 3    Period Weeks    Status New    Target Date 05/31/21               OT Long Term Goals - 05/10/21 1753       OT LONG TERM GOAL #1   Title R thumb strength increase for pt to do opposition and PA/RA without increase symptoms to hold cup , turn doorknob and buttons    Baseline pain 6-7/10 wiht use -and decrease pain and edema- pain end range RA of thumb    Time 6    Period Weeks    Status New    Target Date 06/21/21      OT LONG TERM GOAL #2   Title R wrist AROM and strength WNL to push and pull door, clean counter    Baseline decrease wrist AROM in all plans -and pain 6-7/10    Time 6    Period Weeks    Status New    Target Date 06/21/21      OT LONG TERM GOAL #3   Title R grip and prehension strength increase to more than 60% compare to L hand to cut food, kayak , write,hold book  without increase symptoms    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  Pt cont with increase AROM for thumb in all planes , wrist - will iniitiate strengthening next session . Cont with decrease strength in thumb PA and RA , grip/prehensin strength in R dominant hand limiting her functional use of R hand in ADL's and IADL's  - pt ed on scar tissue and desentitization last time - improved greatly    OT Occupational Profile and History Problem Focused Assessment - Including review of records relating to presenting problem    Occupational performance deficits (Please refer to evaluation for details): ADL's;IADL's;Work;Play;Leisure;Social Participation    Body Structure / Function / Physical Skills ADL;Coordination;Flexibility;FMC;Scar mobility;ROM;IADL;Edema;Dexterity;Pain;Strength;Decreased knowledge of precautions    Rehab Potential Good    Clinical Decision Making Limited treatment options, no task modification necessary    Comorbidities Affecting Occupational Performance: None    Modification or Assistance to Complete Evaluation  No modification of tasks or assist necessary to complete eval    OT Frequency 2x / week    OT Duration 8 weeks    OT Treatment/Interventions Self-care/ADL training;Paraffin;Fluidtherapy;Contrast Bath;Therapeutic exercise;Manual Therapy;Patient/family education;Passive range of motion;Splinting;DME and/or AE instruction             Patient will benefit from skilled therapeutic intervention in order to improve the following deficits and impairments:   Body Structure / Function / Physical Skills: ADL, Coordination, Flexibility, FMC, Scar mobility, ROM, IADL, Edema, Dexterity, Pain, Strength, Decreased knowledge of precautions       Visit Diagnosis: Stiffness of right hand, not elsewhere classified  Stiffness of right wrist, not elsewhere classified  Scar condition and fibrosis of skin  Pain in right hand  Muscle weakness  (generalized)    Problem List Patient Active Problem List   Diagnosis Date Noted   PMB (postmenopausal bleeding) 05/14/2020   Generalized anxiety disorder 04/20/2020   Hyperlipidemia, mixed 04/20/2020   Irritable bowel syndrome with constipation 04/20/2020   Left elbow tendonitis 04/20/2020   Lateral epicondylitis of left elbow 02/19/2019   Mild depression ([place]) 05/29/2017   SUI (stress urinary incontinence, female) 12/28/2011    Rosalyn Gess OTR/L,CLT 05/17/2021, 4:09 PM  [facility] [address] [place], [address] Phone: [phone]   Fax:  [phone]  Name: Michelle Franco MRN: [id number] Date of Birth: 16-Jul-1971

## 2021-05-19 ENCOUNTER — Encounter: Payer: BC Managed Care – PPO | Admitting: Occupational Therapy

## 2021-05-24 ENCOUNTER — Ambulatory Visit: Payer: BC Managed Care – PPO | Admitting: Occupational Therapy

## 2021-05-24 DIAGNOSIS — L905 Scar conditions and fibrosis of skin: Secondary | ICD-10-CM

## 2021-05-24 DIAGNOSIS — M25641 Stiffness of right hand, not elsewhere classified: Secondary | ICD-10-CM | POA: Diagnosis not present

## 2021-05-24 DIAGNOSIS — M79641 Pain in right hand: Secondary | ICD-10-CM

## 2021-05-24 DIAGNOSIS — M25631 Stiffness of right wrist, not elsewhere classified: Secondary | ICD-10-CM

## 2021-05-24 DIAGNOSIS — M6281 Muscle weakness (generalized): Secondary | ICD-10-CM

## 2021-05-24 NOTE — Therapy (Signed)
[facility] [address], [place], [address] Phone: [phone]   Fax:  [phone]  Occupational Therapy Treatment  Patient Details  Name: Michelle Franco MRN: [id number] Date of Birth: 09-18-1971 Referring Provider (OT): Rachelle Hora   Encounter Date: 05/24/2021   OT End of Session - 05/24/21 1612     Visit Number 3    Number of Visits 16    Date for OT Re-Evaluation 07/05/21    OT Start Time 1535    OT Stop Time 1610    OT Time Calculation (min) 35 min    Activity Tolerance Patient tolerated treatment well    Behavior During Therapy [facility] for tasks assessed/performed             Past Medical History:  Diagnosis Date   Anxiety    Basal cell carcinoma (BCC) of upper lip 02/2021   left   Depression    Family history of adverse reaction to anesthesia    father very sleepy after   GERD (gastroesophageal reflux disease)    Hemorrhoids    Hyperlipidemia     Past Surgical History:  Procedure Laterality Date   APPENDECTOMY  1987   CARPOMETACARPAL ([place]) FUSION OF THUMB Right 04/19/2021   Procedure: CARPOMETACARPAL [facility]) FUSION OF THUMB;  Surgeon: Hessie Knows, MD;  Location: [facility] ORS;  Service: Orthopedics;  Laterality: Right;   [place], 2002   x2   COLONOSCOPY WITH ESOPHAGOGASTRODUODENOSCOPY (EGD)  2013   DILITATION & CURRETTAGE/HYSTROSCOPY WITH NOVASURE ABLATION N/A 10/22/2020   Procedure: Fractional Dilitation and Curattage Aborted Novasure due to cavity width;  Surgeon: Schermerhorn, Gwen Her, MD;  Location: [facility] ORS;  Service: Gynecology;  Laterality: N/A;   HEMORROIDECTOMY  2003   INCONTINENCE SURGERY  2010   TVT sling   MOHS SURGERY Left 03/02/2021   upper lip   TONSILLECTOMY  1986    There were no vitals filed for this visit.   Subjective Assessment - 05/24/21 1610     Subjective  My thumb is little sore - i had to cut some things at work with scissors and using the  staple - pushing down on my thumb    Pertinent History Pt had R CMC arthroplasty done by Dr Rudene Christians on 04/19/21 and followed up with PA - had stitches out at 04/28/21 and cont with thumb spica until 3 wks s/p - refer to OT for  hand therapy    Patient Stated Goals I want to be able to go back to work end of month , kayak, read , write cards and do things around the house    Currently in Pain? Yes    Pain Score 3     Pain Location Hand    Pain Orientation Right    Pain Descriptors / Indicators Aching    Pain Type Surgical pain    Pain Onset More than a month ago    Pain Frequency Intermittent                Review with pt getting spring loaded scissors for work and staple using ulnar side of hand , splint on or use L hand           OT Treatments/Exercises (OP) - 05/24/21 0001       RUE Fluidotherapy   Number Minutes Fluidotherapy 8 Minutes    RUE Fluidotherapy Location Hand;Wrist    Comments AROM for wrist and thumb in all planes and  decrease pain             Fluidotherapy done  - pt to do contrast at home 2 x day increase AROM in R wrist and hand Cont with thumb AROM pain free -PA and RA of thumb Circular AROM both ways for thumb IP thumb PROM flexion, add and one block MC flexion PROM pain free And opposition - to all digits- doing oval - 8 reps  Wrist AAROM for flexion, ext, RD, UD 10r eps Add and review from PA to RA composite - place and hold 10 reps  Can use CMC neoprene splint for functional use and strengthening Add isometric strengthening for thumb in all planes 15 reps  Increase to 2nd and 3rd set over the next week - painfree - 2 x day  And then provided upgrade to HEP for pt to use rubber band for PA and RA next week if pain free - 12-15 reps  2 x day - increase sets over next week on vacation if pain free    Ed pt on using Penagain for writing at work to avoid tight pinch last time         OT Education - 05/24/21 1612     Education Details  progress and changes to HEP    Person(s) Educated Patient    Methods Explanation;Demonstration;Tactile cues;Verbal cues;Handout    Comprehension Verbal cues required;Returned demonstration;Verbalized understanding              OT Short Term Goals - 05/10/21 1752       OT SHORT TERM GOAL #1   Title Pt to be independent in HEP to decease pain and increase AROM in R thumb and wrist to [facility] with pain less than 2/10 with use    Baseline pain incease 6-7/10 with use and out of splint-  decrease thumb and wrist AROM    Time 3    Period Weeks    Status New    Target Date 05/31/21               OT Long Term Goals - 05/10/21 1753       OT LONG TERM GOAL #1   Title R thumb strength increase for pt to do opposition and PA/RA without increase symptoms to hold cup , turn doorknob and buttons    Baseline pain 6-7/10 wiht use -and decrease pain and edema- pain end range RA of thumb    Time 6    Period Weeks    Status New    Target Date 06/21/21      OT LONG TERM GOAL #2   Title R wrist AROM and strength WNL to push and pull door, clean counter    Baseline decrease wrist AROM in all plans -and pain 6-7/10    Time 6    Period Weeks    Status New    Target Date 06/21/21      OT LONG TERM GOAL #3   Title R grip and prehension strength increase to more than 60% compare to L hand to cut food, kayak , write,hold book  without increase symptoms    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    Time 8    Period Weeks    Status New    Target Date 07/05/21                   Plan - 05/24/21 1612     Clinical  Impression Statement Pt is 5 wks s/p R thumb CMC arthroplasty - pt was doing well wtih pain last week weaning out of daytime splint and working some hours at summer school - but this date increase pain after using scissors and staple tat work this [place] - review again with pt modifications and using her CMC neoprene splint - initiated strengthening  this week and pt going to be out of town next week provided her with upgrade on HEP if no increase pain - pt show inrease AROM -and able to stabilize thumb CMC out of palm during opposition to all digits - cont to  keep pain under 2/10 during day and HEP.  Pt cont with increase AROM  and strength for thumb in all planes. Cont with decrease strength in thumb PA and RA , grip/prehensin strength in R dominant hand limiting her functional use of R hand in ADL's and IADL's  - pt ed on scar tissue and desentitization at eval - improved greatly    OT Occupational Profile and History Problem Focused Assessment - Including review of records relating to presenting problem    Occupational performance deficits (Please refer to evaluation for details): ADL's;IADL's;Work;Play;Leisure;Social Participation    Body Structure / Function / Physical Skills ADL;Coordination;Flexibility;FMC;Scar mobility;ROM;IADL;Edema;Dexterity;Pain;Strength;Decreased knowledge of precautions    Rehab Potential Good    Clinical Decision Making Limited treatment options, no task modification necessary    Comorbidities Affecting Occupational Performance: None    Modification or Assistance to Complete Evaluation  No modification of tasks or assist necessary to complete eval    OT Frequency 2x / week    OT Duration 8 weeks    OT Treatment/Interventions Self-care/ADL training;Paraffin;Fluidtherapy;Contrast Bath;Therapeutic exercise;Manual Therapy;Patient/family education;Passive range of motion;Splinting;DME and/or AE instruction    Consulted and Agree with Plan of Care Patient             Patient will benefit from skilled therapeutic intervention in order to improve the following deficits and impairments:   Body Structure / Function / Physical Skills: ADL, Coordination, Flexibility, FMC, Scar mobility, ROM, IADL, Edema, Dexterity, Pain, Strength, Decreased knowledge of precautions       Visit Diagnosis: Stiffness of right hand, not  elsewhere classified  Stiffness of right wrist, not elsewhere classified  Scar condition and fibrosis of skin  Pain in right hand  Muscle weakness (generalized)    Problem List Patient Active Problem List   Diagnosis Date Noted   PMB (postmenopausal bleeding) 05/14/2020   Generalized anxiety disorder 04/20/2020   Hyperlipidemia, mixed 04/20/2020   Irritable bowel syndrome with constipation 04/20/2020   Left elbow tendonitis 04/20/2020   Lateral epicondylitis of left elbow 02/19/2019   Mild depression ([place]) 05/29/2017   SUI (stress urinary incontinence, female) 12/28/2011    Rosalyn Gess OTR/L,CLT 05/24/2021, 4:17 PM  [facility] [address] [place], [address] Phone: [phone]   Fax:  [phone]  Name: Leisel Pinette MRN: [id number] Date of Birth: [birth year]

## 2021-06-07 ENCOUNTER — Ambulatory Visit: Payer: BC Managed Care – PPO | Attending: Orthopedic Surgery | Admitting: Occupational Therapy

## 2021-06-07 DIAGNOSIS — M25641 Stiffness of right hand, not elsewhere classified: Secondary | ICD-10-CM | POA: Insufficient documentation

## 2021-06-07 DIAGNOSIS — M25631 Stiffness of right wrist, not elsewhere classified: Secondary | ICD-10-CM | POA: Diagnosis present

## 2021-06-07 DIAGNOSIS — M79641 Pain in right hand: Secondary | ICD-10-CM | POA: Diagnosis present

## 2021-06-07 DIAGNOSIS — M6281 Muscle weakness (generalized): Secondary | ICD-10-CM | POA: Diagnosis present

## 2021-06-07 DIAGNOSIS — L905 Scar conditions and fibrosis of skin: Secondary | ICD-10-CM | POA: Insufficient documentation

## 2021-06-07 NOTE — Therapy (Signed)
[facility] [address] [place], [address] Phone: [phone]   Fax:  [phone]  Occupational Therapy Treatment  Patient Details  Name: Michelle Franco MRN: [id number] Date of Birth: 1971/08/22 Referring Provider (OT): Rachelle Hora   Encounter Date: 06/07/2021   OT End of Session - 06/07/21 1536     Visit Number 4    Number of Visits 16    Date for OT Re-Evaluation 07/05/21    OT Start Time 1440    OT Stop Time 1520    OT Time Calculation (min) 40 min    Activity Tolerance Patient tolerated treatment well    Behavior During Therapy [facility] for tasks assessed/performed             Past Medical History:  Diagnosis Date   Anxiety    Basal cell carcinoma (BCC) of upper lip 02/2021   left   Depression    Family history of adverse reaction to anesthesia    father very sleepy after   GERD (gastroesophageal reflux disease)    Hemorrhoids    Hyperlipidemia     Past Surgical History:  Procedure Laterality Date   APPENDECTOMY  1987   CARPOMETACARPAL ([place]) FUSION OF THUMB Right 04/19/2021   Procedure: CARPOMETACARPAL [facility]) FUSION OF THUMB;  Surgeon: Hessie Knows, MD;  Location: [facility] OR[facility];  Service: Orthopedics;  Laterality: Right;   [place], 2002   x2   COLONOSCOPY WITH ESOPHAGOGASTRODUODENOSCOPY (EGD)  2013   DILITATION & CURRETTAGE/HYSTROSCOPY WITH NOVASURE ABLATION N/A 10/22/2020   Procedure: Fractional Dilitation and Curattage Aborted Novasure due to cavity width;  Surgeon: Schermerhorn, Gwen Her, MD;  Location: [facility];  Service: Gynecology;  Laterality: N/A;   HEMORROIDECTOMY  2003   INCONTINENCE SURGERY  2010   TVT sling   MOHS SURGERY Left 03/02/2021   upper lip   TONSILLECTOMY  1986    There were no vitals filed for this visit.   Subjective Assessment - 06/07/21 1534     Subjective  Doing okay- I miss placed my soft splint for 4 days and soreness increased - but doing okay-  got stitches out of my scar - Dr Rudene Christians said to put on neosporin    Pertinent History Pt had R [place] arthroplasty done by Dr Rudene Christians on 04/19/21 and followed up with PA - had stitches out at 04/28/21 and cont with thumb spica until 3 wks s/p - refer to OT for  hand therapy    Patient Stated Goals I want to be able to go back to work end of month , kayak, read , write cards and do things around the house    Currently in Pain? Yes    Pain Score 2     Pain Location Hand    Pain Orientation Right    Pain Descriptors / Indicators Sore    Pain Type Surgical pain    Pain Onset More than a month ago    Pain Frequency Intermittent                [facility] OT Assessment - 06/07/21 0001       Strength   Right Hand Grip (lbs) 44    Right Hand Lateral Pinch 7 lbs    Right Hand 3 Point Pinch 10 lbs    Left Hand Grip (lbs) 72    Left Hand Lateral Pinch 15 lbs    Left Hand 3 Point Pinch 16 lbs  Right Hand AROM   R Thumb Opposition to Index --   to all digits - stabilize CMC            Strength in thumb PA and RA improving 4/5 in all planes  Grip and prehension assess - flow sheet          OT Treatments/Exercises (OP) - 06/07/21 0001       RUE Fluidotherapy   Number Minutes Fluidotherapy 8 Minutes    RUE Fluidotherapy Location Hand;Wrist    Comments AROM prior to review of new HEP             Fluidotherapy done to decrease soreness and prior to review and upgrade of HEP  2 x day increase AROM in R wrist and hand Cont with thumb AROM pain free -PA and RA of thumb Circular AROM both ways for thumb IP thumb PROM flexion, add and one block MC flexion PROM pain free And opposition - to all digits- doing oval - 8 reps  Wrist AAROM for flexion, ext, RD, UD 10r eps  Do above as warmup   Cont with PA to RA composite - place and hold 10 reps  Can use CMC neoprene splint for functional use and strengthening  Cont with  rubber band for PA and RA -pain free - 12-15 reps 2 sets  of 12 2 x day   Upgrade to teal putty for gripping 12 -15 reps And then lat and 3 point into short log- 12-15 reps pain free  2 x day painfree  Can increase to 2nd set and 3 rd set over the next 3 and 6 days  1 lbs weight for wrist in all planes - 15 reps painfree  2 x day  And 2nd and 3rd set over the next 3-6 days    Ed pt on using Penagain for writing at work to avoid tight pinch last time         OT Education - 06/07/21 1536     Education Details progress and changes to HEP    Person(s) Educated Patient    Methods Explanation;Demonstration;Tactile cues;Verbal cues;Handout    Comprehension Verbal cues required;Returned demonstration;Verbalized understanding              OT Short Term Goals - 05/10/21 1752       OT SHORT TERM GOAL #1   Title Pt to be independent in HEP to decease pain and increase AROM in R thumb and wrist to [facility] with pain less than 2/10 with use    Baseline pain incease 6-7/10 with use and out of splint-  decrease thumb and wrist AROM    Time 3    Period Weeks    Status New    Target Date 05/31/21               OT Long Term Goals - 05/10/21 1753       OT LONG TERM GOAL #1   Title R thumb strength increase for pt to do opposition and PA/RA without increase symptoms to hold cup , turn doorknob and buttons    Baseline pain 6-7/10 wiht use -and decrease pain and edema- pain end range RA of thumb    Time 6    Period Weeks    Status New    Target Date 06/21/21      OT LONG TERM GOAL #2   Title R wrist AROM and strength WNL to push and pull door, clean counter  Baseline decrease wrist AROM in all plans -and pain 6-7/10    Time 6    Period Weeks    Status New    Target Date 06/21/21      OT LONG TERM GOAL #3   Title R grip and prehension strength increase to more than 60% compare to L hand to cut food, kayak , write,hold book  without increase symptoms    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    Time 8     Period Weeks    Status New    Target Date 07/05/21                   Plan - 06/07/21 1537     OT Occupational Profile and History Problem Focused Assessment - Including review of records relating to presenting problem    Occupational performance deficits (Please refer to evaluation for details): ADL's;IADL's;Work;Play;Leisure;Social Participation    Body Structure / Function / Physical Skills ADL;Coordination;Flexibility;FMC;Scar mobility;ROM;IADL;Edema;Dexterity;Pain;Strength;Decreased knowledge of precautions    Rehab Potential Good    Clinical Decision Making Limited treatment options, no task modification necessary    Comorbidities Affecting Occupational Performance: None    Modification or Assistance to Complete Evaluation  No modification of tasks or assist necessary to complete eval    OT Frequency 1x / week    OT Duration 6 weeks    OT Treatment/Interventions Self-care/ADL training;Paraffin;Fluidtherapy;Contrast Bath;Therapeutic exercise;Manual Therapy;Patient/family education;Passive range of motion;Splinting;DME and/or AE instruction    Consulted and Agree with Plan of Care Patient             Patient will benefit from skilled therapeutic intervention in order to improve the following deficits and impairments:   Body Structure / Function / Physical Skills: ADL, Coordination, Flexibility, FMC, Scar mobility, ROM, IADL, Edema, Dexterity, Pain, Strength, Decreased knowledge of precautions       Visit Diagnosis: Stiffness of right hand, not elsewhere classified  Stiffness of right wrist, not elsewhere classified  Scar condition and fibrosis of skin  Pain in right hand  Muscle weakness (generalized)    Problem List Patient Active Problem List   Diagnosis Date Noted   PMB (postmenopausal bleeding) 05/14/2020   Generalized anxiety disorder 04/20/2020   Hyperlipidemia, mixed 04/20/2020   Irritable bowel syndrome with constipation 04/20/2020   Left elbow  tendonitis 04/20/2020   Lateral epicondylitis of left elbow 02/19/2019   Mild depression ([place]) 05/29/2017   SUI (stress urinary incontinence, female) 12/28/2011    Rosalyn Gess OTR/L,CLT 06/07/2021, 3:39 PM  [facility] [address] [place], [address] Phone: [phone]   Fax:  [phone]  Name: Michelle Franco MRN: [id number] Date of Birth: 05-20-1971

## 2021-06-15 ENCOUNTER — Ambulatory Visit: Payer: BC Managed Care – PPO | Admitting: Occupational Therapy

## 2021-06-15 ENCOUNTER — Other Ambulatory Visit: Payer: Self-pay | Admitting: Obstetrics and Gynecology

## 2021-06-15 DIAGNOSIS — Z1231 Encounter for screening mammogram for malignant neoplasm of breast: Secondary | ICD-10-CM

## 2021-06-17 ENCOUNTER — Ambulatory Visit: Payer: BC Managed Care – PPO | Admitting: Occupational Therapy

## 2021-06-17 ENCOUNTER — Other Ambulatory Visit: Payer: Self-pay

## 2021-06-17 DIAGNOSIS — M25641 Stiffness of right hand, not elsewhere classified: Secondary | ICD-10-CM | POA: Diagnosis not present

## 2021-06-17 DIAGNOSIS — M6281 Muscle weakness (generalized): Secondary | ICD-10-CM

## 2021-06-17 DIAGNOSIS — L905 Scar conditions and fibrosis of skin: Secondary | ICD-10-CM

## 2021-06-17 DIAGNOSIS — M79641 Pain in right hand: Secondary | ICD-10-CM

## 2021-06-17 DIAGNOSIS — M25631 Stiffness of right wrist, not elsewhere classified: Secondary | ICD-10-CM

## 2021-06-17 NOTE — Therapy (Signed)
[place] [facility] [address] [place], [address] Phone: [phone]   Fax:  [phone]  Occupational Therapy Treatment  Patient Details  Name: Michelle Franco MRN: [id number] Date of Birth: [birth year] Referring Provider (OT): Rachelle Hora   Encounter Date: 06/17/2021   OT End of Session - 06/17/21 1211     Visit Number 5    Number of Visits 16    Date for OT Re-Evaluation 07/05/21    OT Start Time [id number]    OT Stop Time 1124    OT Time Calculation (min) 44 min    Activity Tolerance Patient tolerated treatment well    Behavior During Therapy [facility] for tasks assessed/performed             Past Medical History:  Diagnosis Date   Anxiety    Basal cell carcinoma (BCC) of upper lip 02/2021   left   Depression    Family history of adverse reaction to anesthesia    father very sleepy after   GERD (gastroesophageal reflux disease)    Hemorrhoids    Hyperlipidemia     Past Surgical History:  Procedure Laterality Date   APPENDECTOMY  1987   CARPOMETACARPAL ([place]) FUSION OF THUMB Right 04/19/2021   Procedure: CARPOMETACARPAL [facility]) FUSION OF THUMB;  Surgeon: Hessie Knows, MD;  Location: [facility] ORS;  Service: Orthopedics;  Laterality: Right;   [place], 2002   x2   COLONOSCOPY WITH ESOPHAGOGASTRODUODENOSCOPY (EGD)  2013   DILITATION & CURRETTAGE/HYSTROSCOPY WITH NOVASURE ABLATION N/A 10/22/2020   Procedure: Fractional Dilitation and Curattage Aborted Novasure due to cavity width;  Surgeon: Schermerhorn, Gwen Her, MD;  Location: [facility] ORS;  Service: Gynecology;  Laterality: N/A;   HEMORROIDECTOMY  2003   INCONTINENCE SURGERY  2010   TVT sling   MOHS SURGERY Left 03/02/2021   upper lip   TONSILLECTOMY  1986    There were no vitals filed for this visit.   Subjective Assessment - 06/17/21 1106     Subjective  Doing okay - using it more and soft splint about 25% of the time- when I go back to school  next week will take it with me - more discomfort than pain -doing okay with exercises    Pertinent History Pt had R CMC arthroplasty done by Dr Rudene Christians on 04/19/21 and followed up with PA - had stitches out at 04/28/21 and cont with thumb spica until 3 wks s/p - refer to OT for  hand therapy    Patient Stated Goals I want to be able to go back to work end of month , kayak, read , write cards and do things around the house    Currently in Pain? No/denies                [facility] OT Assessment - 06/17/21 0001       AROM   Right Wrist Extension 65 Degrees    Right Wrist Flexion 90 Degrees      Strength   Right Hand Grip (lbs) 46    Right Hand Lateral Pinch 10 lbs    Right Hand 3 Point Pinch 12 lbs    Left Hand Grip (lbs) 72    Left Hand Lateral Pinch 15 lbs    Left Hand 3 Point Pinch 16 lbs      Right Hand AROM   R Thumb MCP 0-60 60 Degrees    R Thumb IP 0-80 50 Degrees  R Thumb Radial ABduction/ADduction 0-55 55    R Thumb Palmar ABduction/ADduction 0-45 60                      OT Treatments/Exercises (OP) - 06/17/21 0001       RUE Fluidotherapy   Number Minutes Fluidotherapy 8 Minutes    RUE Fluidotherapy Location Hand;Wrist    Comments AROM for thumb and wrist to decrease stiffness and pain RD of wrist             Fluidotherapy done to decrease soreness  RD and UD of wrist - decrease stiffness - prior to review and upgrade of HEP  2 x day increase AROM in R wrist and hand   Cont as needed with thumb AROM pain free -PA and RA of thumb Circular AROM both ways for thumb IP thumb PROM flexion, add and one block MC flexion PROM pain free And opposition - to all digits- doing oval - 8 reps  Wrist AAROM for flexion, ext, RD, UD 10r eps  Do above as warmup    Cont with PA to RA composite - place and hold 10 reps  Can use CMC neoprene splint for functional use and strengthening as needed   Cont with  rubber band for PA and RA -pain free - 12-15 reps 2 sets  of 12 2 x day    Upgrade to green [employer] putty for gripping 12 -15 reps And then lat and 3 point into short log- 12-15 reps pain free  2 x day painfree  Can increase to 2nd set and 3 rd set over the next 3 and 6 days   Upgrade to 2 lbs weight for wrist in all planes - 12 reps painfree  2 x day  And 2nd and 3rd set over the next 3-6 days    Ed pt on using Penagain for writing at work to avoid tight pinch last time          OT Education - 06/17/21 1211     Education Details progress and changes to HEP    Person(s) Educated Patient    Methods Explanation;Demonstration;Tactile cues;Verbal cues;Handout    Comprehension Verbal cues required;Returned demonstration;Verbalized understanding              OT Short Term Goals - 05/10/21 1752       OT SHORT TERM GOAL #1   Title Pt to be independent in HEP to decease pain and increase AROM in R thumb and wrist to [facility] with pain less than 2/10 with use    Baseline pain incease 6-7/10 with use and out of splint-  decrease thumb and wrist AROM    Time 3    Period Weeks    Status New    Target Date 05/31/21               OT Long Term Goals - 05/10/21 1753       OT LONG TERM GOAL #1   Title R thumb strength increase for pt to do opposition and PA/RA without increase symptoms to hold cup , turn doorknob and buttons    Baseline pain 6-7/10 wiht use -and decrease pain and edema- pain end range RA of thumb    Time 6    Period Weeks    Status New    Target Date 06/21/21      OT LONG TERM GOAL #2   Title R wrist AROM and strength WNL to  push and pull door, clean counter    Baseline decrease wrist AROM in all plans -and pain 6-7/10    Time 6    Period Weeks    Status New    Target Date 06/21/21      OT LONG TERM GOAL #3   Title R grip and prehension strength increase to more than 60% compare to L hand to cut food, kayak , write,hold book  without increase symptoms    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 Upgrade putty resistance and wrist exercises to 2 lbs  - pt show inrease AROM -and able to stabilize thumb CMC out of palm during opposition to all digits - cont to  keep pain under 2/10 during day and HEP.  Pt cont with increasing strength for thumb in all planes. Pt can cont to benefit from OT services to increase  strength in R dominant hand to increase her functional use of R hand in ADL's and IADL's.    OT Occupational Profile and History Problem Focused Assessment - Including review of records relating to presenting problem    Occupational performance deficits (Please refer to evaluation for details): ADL's;IADL's;Work;Play;Leisure;Social Participation    Body Structure / Function / Physical Skills ADL;Coordination;Flexibility;FMC;Scar mobility;ROM;IADL;Edema;Dexterity;Pain;Strength;Decreased knowledge of precautions    Rehab Potential Good    Clinical Decision Making Limited treatment options, no task modification necessary    Comorbidities Affecting Occupational Performance: None    Modification or Assistance to Complete Evaluation  No modification of tasks or assist necessary to complete eval    OT Frequency Biweekly    OT Duration 4 weeks    OT Treatment/Interventions Self-care/ADL training;Paraffin;Fluidtherapy;Contrast Bath;Therapeutic exercise;Manual Therapy;Patient/family education;Passive range of motion;Splinting;DME and/or AE instruction    Consulted and Agree with Plan of Care Patient             Patient will benefit from skilled  therapeutic intervention in order to improve the following deficits and impairments:   Body Structure / Function / Physical Skills: ADL, Coordination, Flexibility, FMC, Scar mobility, ROM, IADL, Edema, Dexterity, Pain, Strength, Decreased knowledge of precautions       Visit Diagnosis: Stiffness of right hand, not elsewhere classified  Stiffness of right wrist, not elsewhere classified  Scar condition and fibrosis of skin  Pain in right hand  Muscle weakness (generalized)    Problem List Patient Active Problem List   Diagnosis Date Noted   PMB (postmenopausal bleeding) 05/14/2020   Generalized anxiety disorder 04/20/2020   Hyperlipidemia, mixed 04/20/2020   Irritable bowel syndrome with constipation 04/20/2020   Left elbow tendonitis 04/20/2020   Lateral epicondylitis of left elbow 02/19/2019   Mild depression ([place]) 05/29/2017   SUI (stress urinary incontinence, female) 12/28/2011    Rosalyn Gess OTR/L,CLT 06/17/2021, 12:18 PM  [place] [facility] [address]  [place], [place], [address] Phone: [phone]   Fax:  [phone]  Name: Michelle Franco MRN: [id number] Date of Birth: June 18, 1971

## 2021-06-27 ENCOUNTER — Ambulatory Visit: Payer: BC Managed Care – PPO | Admitting: Occupational Therapy

## 2021-06-27 DIAGNOSIS — M6281 Muscle weakness (generalized): Secondary | ICD-10-CM

## 2021-06-27 DIAGNOSIS — M79641 Pain in right hand: Secondary | ICD-10-CM

## 2021-06-27 DIAGNOSIS — M25641 Stiffness of right hand, not elsewhere classified: Secondary | ICD-10-CM

## 2021-06-27 DIAGNOSIS — L905 Scar conditions and fibrosis of skin: Secondary | ICD-10-CM

## 2021-06-27 DIAGNOSIS — M25631 Stiffness of right wrist, not elsewhere classified: Secondary | ICD-10-CM

## 2021-06-27 NOTE — Therapy (Signed)
[place] [facility] [address] [place], [address] Phone: [phone]   Fax:  [phone]  Occupational Therapy Treatment  Patient Details  Name: Michelle Franco MRN: [id number] Date of Birth: [birth year] Referring Provider (OT): Rachelle Hora   Encounter Date: 06/27/2021   OT End of Session - 06/27/21 1649     Visit Number 6    Number of Visits 16    Date for OT Re-Evaluation 07/05/21    OT Start Time 1610    OT Stop Time 1645    OT Time Calculation (min) 35 min    Activity Tolerance Patient tolerated treatment well    Behavior During Therapy [facility] for tasks assessed/performed             Past Medical History:  Diagnosis Date   Anxiety    Basal cell carcinoma (BCC) of upper lip 02/2021   left   Depression    Family history of adverse reaction to anesthesia    father very sleepy after   GERD (gastroesophageal reflux disease)    Hemorrhoids    Hyperlipidemia     Past Surgical History:  Procedure Laterality Date   APPENDECTOMY  1987   CARPOMETACARPAL ([place]) FUSION OF THUMB Right 04/19/2021   Procedure: CARPOMETACARPAL [facility]) FUSION OF THUMB;  Surgeon: Hessie Knows, MD;  Location: [facility] ORS;  Service: Orthopedics;  Laterality: Right;   [place], 2002   x2   COLONOSCOPY WITH ESOPHAGOGASTRODUODENOSCOPY (EGD)  2013   DILITATION & CURRETTAGE/HYSTROSCOPY WITH NOVASURE ABLATION N/A 10/22/2020   Procedure: Fractional Dilitation and Curattage Aborted Novasure due to cavity width;  Surgeon: Schermerhorn, Gwen Her, MD;  Location: [facility] OR[facility];  Service: Gynecology;  Laterality: N/A;   HEMORROIDECTOMY  2003   INCONTINENCE SURGERY  2010   TVT sling   MOHS SURGERY Left 03/02/2021   upper lip   TONSILLECTOMY  1986    There were no vitals filed for this visit.   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 Yes    Pain Score 1     Pain Location Hand    Pain Orientation Right    Pain Descriptors / Indicators Sore    Pain Type Surgical pain    Pain Onset More than a month ago    Pain Frequency Intermittent                [facility] OT Assessment - 06/27/21 0001       Strength   Right Hand Grip (lbs) 55    Right Hand Lateral Pinch 10 lbs    Right Hand 3 Point Pinch 12 lbs    Left Hand Grip (lbs) 72    Left Hand Lateral Pinch 15 lbs    Left Hand 3 Point Pinch 16 lbs  Grip increase - but prehension about same - pain free  Sore from back to work Able to carry and lift 10 lbs without pain      OT Treatments/Exercises (OP) - 06/27/21 0001       RUE Fluidotherapy   Number Minutes Fluidotherapy 8 Minutes    RUE Fluidotherapy Location Hand;Wrist    Comments AROM for wrist and thumb- decrease soreness             Fluidotherapy done to decrease soreness   - prior to review of HEP  2 x day    Cont as needed with thumb AROM pain free -PA and RA of thumb Circular AROM both ways for thumb IP thumb PROM flexion, add and one block MC flexion PROM pain free And opposition - to all digits- doing oval - 8 reps  Wrist AAROM for flexion, ext, RD, UD 10r eps  Do above as warmup if needed   Cont with  resting hand in RA   Can use CMC neoprene splint for functional use and strengthening as needed   Cont with  rubber band for PA and RA -pain free - 12-15 reps 2 sets of 12 2 x day    Cont green med firm putty for gripping 12 -15 reps And then  lat and 3 point into short log- 12-15 reps pain free  2 x day painfree  Can cont with 2 sets - but doing 3 sec into putty and more into the bulk  Increase over next week to 3rd set -and not using CMC neoprene if pain free     Cont with 2 lbs weight for wrist in all planes - 12 reps painfree  2 x day  2-3 sets depending on how she feels end of day after work           OT Education - 06/27/21 1649     Education Details progress and changes to HEP    Person(s) Educated Patient    Methods Explanation;Demonstration;Tactile cues;Verbal cues;Handout    Comprehension Verbal cues required;Returned demonstration;Verbalized understanding              OT Short Term Goals - 05/10/21 1752       OT SHORT TERM GOAL #1   Title Pt to be independent in HEP to decease pain and increase AROM in R thumb and wrist to [facility] with pain less than 2/10 with use    Baseline pain incease 6-7/10 with use and out of splint-  decrease thumb and wrist AROM    Time 3    Period Weeks    Status New    Target Date 05/31/21               OT Long Term Goals - 05/10/21 1753       OT LONG TERM GOAL #1   Title R thumb strength increase for pt to do opposition and PA/RA without increase symptoms to hold cup , turn doorknob and buttons    Baseline pain 6-7/10 wiht use -and decrease pain and edema- pain end range RA of thumb    Time 6    Period Weeks    Status New    Target Date 06/21/21      OT LONG TERM GOAL #2   Title R wrist AROM and strength WNL to push and pull door, clean counter    Baseline decrease wrist AROM in all plans -and pain 6-7/10    Time 6  Period Weeks    Status New    Target Date 06/21/21      OT LONG TERM GOAL #3   Title R grip and prehension strength increase to more than 60% compare to L hand to cut food, kayak , write,hold book  without increase symptoms    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    Time 8    Period Weeks    Status New    Target  Date 07/05/21                   Plan - 06/27/21 1650     Clinical Impression Statement Pt is 10 wks s/p R thumb CMC arthroplasty - Pt doing very well this date with using CMC neoprene splint only 30% with going back to work and doing her HEP sometimes without -  discomfort more than pain still- pt show increase grip but prehension strength about the same- Kept pt on same putty resistance and wrist exercises wtih 2 lbs  - pt cont to stabilize thumb CMC out of palm during opposition to all digits and putty prehension - cont to  keep pain under 2/10 during day with HEP and using hand at work.  Pt  to focus on pinching deeper in putty - that should incease her prehension - Strength great with thumb PA, RA and wrist in all planes . Pt can cont to benefit from OT services to increase  strength in R dominant hand to increase her functional use of R hand painfree in ADL's and IADL's.    OT Occupational Profile and History Problem Focused Assessment - Including review of records relating to presenting problem    Occupational performance deficits (Please refer to evaluation for details): ADL's;IADL's;Work;Play;Leisure;Social Participation    Body Structure / Function / Physical Skills ADL;Coordination;Flexibility;FMC;Scar mobility;ROM;IADL;Edema;Dexterity;Pain;Strength;Decreased knowledge of precautions    Rehab Potential Good    Clinical Decision Making Limited treatment options, no task modification necessary    Comorbidities Affecting Occupational Performance: None    Modification or Assistance to Complete Evaluation  No modification of tasks or assist necessary to complete eval    OT Frequency Biweekly    OT Duration 4 weeks    OT Treatment/Interventions Self-care/ADL training;Paraffin;Fluidtherapy;Contrast Bath;Therapeutic exercise;Manual Therapy;Patient/family education;Passive range of motion;Splinting;DME and/or AE instruction    Consulted and Agree with Plan of Care Patient              Patient will benefit from skilled therapeutic intervention in order to improve the following deficits and impairments:   Body Structure / Function / Physical Skills: ADL, Coordination, Flexibility, FMC, Scar mobility, ROM, IADL, Edema, Dexterity, Pain, Strength, Decreased knowledge of precautions       Visit Diagnosis: Stiffness of right hand, not elsewhere classified  Stiffness of right wrist, not elsewhere classified  Scar condition and fibrosis of skin  Pain in right hand  Muscle weakness (generalized)    Problem List Patient Active Problem List   Diagnosis Date Noted   PMB (postmenopausal bleeding) 05/14/2020   Generalized anxiety disorder 04/20/2020   Hyperlipidemia, mixed 04/20/2020   Irritable bowel syndrome with constipation 04/20/2020   Left elbow tendonitis 04/20/2020   Lateral epicondylitis of left elbow 02/19/2019   Mild depression ([place]) 05/29/2017   SUI (stress urinary incontinence, female) 12/28/2011    Rosalyn Gess OTR/l,CLT 06/27/2021, 4:55 PM   [place] [facility] [address], [place], [address] Phone: [phone]  Fax:  [phone]  Name: Michelle Franco MRN: [id number] Date of Birth: [birth year]

## 2021-07-07 ENCOUNTER — Ambulatory Visit: Payer: BC Managed Care – PPO | Attending: Orthopedic Surgery | Admitting: Occupational Therapy

## 2021-07-07 DIAGNOSIS — M25641 Stiffness of right hand, not elsewhere classified: Secondary | ICD-10-CM | POA: Diagnosis present

## 2021-07-07 DIAGNOSIS — M25631 Stiffness of right wrist, not elsewhere classified: Secondary | ICD-10-CM | POA: Insufficient documentation

## 2021-07-07 DIAGNOSIS — M6281 Muscle weakness (generalized): Secondary | ICD-10-CM | POA: Diagnosis present

## 2021-07-07 DIAGNOSIS — L905 Scar conditions and fibrosis of skin: Secondary | ICD-10-CM | POA: Diagnosis present

## 2021-07-07 DIAGNOSIS — M79641 Pain in right hand: Secondary | ICD-10-CM | POA: Insufficient documentation

## 2021-07-07 NOTE — Therapy (Signed)
[place] [facility] [address] [place], [address] Phone: [phone]   Fax:  [phone]  Occupational Therapy Treatment/discharge  Patient Details  Name: Michelle Franco MRN: [id number] Date of Birth: [birth year] Referring Provider (OT): Rachelle Hora   Encounter Date: 07/07/2021   OT End of Session - 07/07/21 1554     Visit Number 7    Number of Visits 7    Date for OT Re-Evaluation 07/07/21    OT Start Time 1525    OT Stop Time 1550    OT Time Calculation (min) 25 min    Activity Tolerance Patient tolerated treatment well    Behavior During Therapy [facility] for tasks assessed/performed             Past Medical History:  Diagnosis Date   Anxiety    Basal cell carcinoma (BCC) of upper lip 02/2021   left   Depression    Family history of adverse reaction to anesthesia    father very sleepy after   GERD (gastroesophageal reflux disease)    Hemorrhoids    Hyperlipidemia     Past Surgical History:  Procedure Laterality Date   APPENDECTOMY  1987   CARPOMETACARPAL ([place]) FUSION OF THUMB Right 04/19/2021   Procedure: CARPOMETACARPAL [facility]) FUSION OF THUMB;  Surgeon: Hessie Knows, MD;  Location: [facility] ORS;  Service: Orthopedics;  Laterality: Right;   [place], 2002   x2   COLONOSCOPY WITH ESOPHAGOGASTRODUODENOSCOPY (EGD)  2013   DILITATION & CURRETTAGE/HYSTROSCOPY WITH NOVASURE ABLATION N/A 10/22/2020   Procedure: Fractional Dilitation and Curattage Aborted Novasure due to cavity width;  Surgeon: Schermerhorn, Gwen Her, MD;  Location: [facility] ORS;  Service: Gynecology;  Laterality: N/A;   HEMORROIDECTOMY  2003   INCONTINENCE SURGERY  2010   TVT sling   MOHS SURGERY Left 03/02/2021   upper lip   TONSILLECTOMY  1986    There were no vitals filed for this visit.   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 No/denies                [facility] OT Assessment - 07/07/21 0001       AROM   Right Wrist Extension 70 Degrees    Right Wrist Flexion 90 Degrees      Strength   Right Hand Grip (lbs) 60    Right Hand Lateral Pinch 11 lbs    Right Hand 3 Point Pinch 15 lbs    Left Hand Grip (lbs) 72    Left Hand Lateral Pinch 15 lbs    Left Hand 3 Point Pinch 16 lbs             Thumb PA and RA  5/5 Thumb CMC hyper extention lagging - but pt to rest in position  Wrist AROM and strength WNL       Can use CMC neoprene splint for functional use and strengthening as needed   Cont with  rubber band for PA and RA -pain free - 12-15 reps 2 sets of 12 For couple of weeks   Cont green med firm putty for gripping for few wks -pain free    Can start back gradually working out - table slides - wall pushups  Planks on elbows first  And with weights - start low weights and reps/sets and gradually increase                 OT Education - 07/07/21 1554     Education Details progress and discharge instructions    Person(s) Educated Patient    Methods Explanation;Demonstration;Tactile cues;Verbal cues;Handout    Comprehension Verbal cues required;Returned demonstration;Verbalized understanding              OT Short Term Goals - 07/07/21 1605       OT SHORT TERM GOAL #1   Title Pt to be independent in HEP to decease pain and increase AROM in R thumb and wrist to [facility] with pain less  than 2/10 with use    Status Achieved               OT Long Term Goals - 07/07/21 1606       OT LONG TERM GOAL #1   Title R thumb strength increase for pt to do opposition and PA/RA without increase symptoms to hold cup , turn doorknob and buttons    Status Achieved      OT LONG TERM GOAL #2   Title R wrist AROM and strength WNL to push and pull door, clean counter    Status Achieved      OT LONG TERM GOAL #3   Title R grip and prehension strength increase to more than 60% compare to L hand to cut food, kayak , write,hold book  without increase symptoms    Status Achieved                   Plan - 07/07/21 1556     Clinical Impression Statement Pt is 11 wks s/p R thumb CMC arthroplasty - Pt great progress in AROM in thumb and wrist in all planes - Pt grip and prehension cont to improve weekly -Wrist AROM and strength WNL -  grip still decrease but pt to cont with putty HEP - thumb PA and RA 5/5 - pt still show some decrease hyper extention of CMC - but able to stabilize with thumb during 3 point pinch - no issues using thumb- pt to cont to use CMC neoprene as needed and keep pain free - pt discharge at this time    OT Occupational Profile and History Problem Focused Assessment - Including review of records relating to presenting problem    Occupational performance deficits (Please refer to evaluation for details): ADL's;IADL's;Work;Play;Leisure;Social Participation    Body Structure / Function / Physical Skills ADL;Coordination;Flexibility;FMC;Scar mobility;ROM;IADL;Edema;Dexterity;Pain;Strength;Decreased knowledge of precautions    Rehab Potential Good    Clinical Decision Making Limited treatment options, no task modification necessary    Comorbidities Affecting Occupational Performance: None    Modification or Assistance to Complete Evaluation  No modification of tasks or assist necessary to complete eval    OT Treatment/Interventions Self-care/ADL  training;Paraffin;Fluidtherapy;Contrast Bath;Therapeutic exercise;Manual Therapy;Patient/family education;Passive range  of motion;Splinting;DME and/or AE instruction    Consulted and Agree with Plan of Care Patient             Patient will benefit from skilled therapeutic intervention in order to improve the following deficits and impairments:   Body Structure / Function / Physical Skills: ADL, Coordination, Flexibility, FMC, Scar mobility, ROM, IADL, Edema, Dexterity, Pain, Strength, Decreased knowledge of precautions       Visit Diagnosis: Stiffness of right hand, not elsewhere classified - Plan: Ot plan of care cert/re-cert  Stiffness of right wrist, not elsewhere classified - Plan: Ot plan of care cert/re-cert  Scar condition and fibrosis of skin - Plan: Ot plan of care cert/re-cert  Pain in right hand - Plan: Ot plan of care cert/re-cert  Muscle weakness (generalized) - Plan: Ot plan of care cert/re-cert    Problem List Patient Active Problem List   Diagnosis Date Noted   PMB (postmenopausal bleeding) 05/14/2020   Generalized anxiety disorder 04/20/2020   Hyperlipidemia, mixed 04/20/2020   Irritable bowel syndrome with constipation 04/20/2020   Left elbow tendonitis 04/20/2020   Lateral epicondylitis of left elbow 02/19/2019   Mild depression ([place]) 05/29/2017   SUI (stress urinary incontinence, female) 12/28/2011    Rosalyn Gess OTR/L,CLT 07/07/2021, 4:08 PM  [place] [facility] [address] [place], [address] Phone: [phone]   Fax:  [phone]  Name: Michelle Franco MRN: [id number] Date of Birth: [birth year]

## 2021-08-25 ENCOUNTER — Ambulatory Visit
Admission: RE | Admit: 2021-08-25 | Discharge: 2021-08-25 | Disposition: A | Discharge status: Home / Self Care | Payer: BC Managed Care – PPO | Source: Ambulatory Visit | Attending: Obstetrics and Gynecology | Admitting: Obstetrics and Gynecology

## 2021-08-25 ENCOUNTER — Other Ambulatory Visit: Payer: Self-pay

## 2021-08-25 DIAGNOSIS — Z1231 Encounter for screening mammogram for malignant neoplasm of breast: Secondary | ICD-10-CM | POA: Diagnosis not present

## 2021-12-03 ENCOUNTER — Ambulatory Visit
Admission: EM | Admit: 2021-12-03 | Discharge: 2021-12-03 | Disposition: A | Discharge status: Home / Self Care | Payer: BC Managed Care – PPO | Attending: Emergency Medicine | Admitting: Emergency Medicine

## 2021-12-03 ENCOUNTER — Other Ambulatory Visit: Payer: Self-pay

## 2021-12-03 ENCOUNTER — Encounter: Payer: Self-pay | Admitting: Emergency Medicine

## 2021-12-03 DIAGNOSIS — U071 COVID-19: Secondary | ICD-10-CM

## 2021-12-03 DIAGNOSIS — R051 Acute cough: Secondary | ICD-10-CM

## 2021-12-03 MED ORDER — ALBUTEROL SULFATE HFA 108 (90 BASE) MCG/ACT IN AERS: 1.0000 | INHALATION_SPRAY | Freq: Four times a day (QID) | RESPIRATORY_TRACT | 0 refills | Status: AC | PRN

## 2021-12-03 NOTE — ED Provider Notes (Signed)
Michelle Franco    CSN: [id number] Arrival date & time: 12/03/21  0840      History   Chief Complaint Chief Complaint  Patient presents with   Nasal Congestion   Sore Throat    HPI Michelle Franco is a 51 y.o. female.  Patient presents with runny nose, congestion, postnasal drip, sore throat, cough since 12/01/2021.  She tested positive for COVID at home this morning.  She denies fever, rash, shortness of breath, vomiting, diarrhea, or other symptoms.  OTC treatment at home.  She states she needs a PCR test for work.  Her medical history includes basal cell carcinoma, IBS, hyperlipidemia, GERD, anxiety, depression.  Patient states she has needed an albuterol inhaler in the past when she has had a respiratory illness; no history of asthma.  The history is provided by the patient and medical records.   Past Medical History:  Diagnosis Date   Anxiety    Basal cell carcinoma (BCC) of upper lip 02/2021   left   Depression    Family history of adverse reaction to anesthesia    father very sleepy after   GERD (gastroesophageal reflux disease)    Hemorrhoids    Hyperlipidemia     Patient Active Problem List   Diagnosis Date Noted   PMB (postmenopausal bleeding) 05/14/2020   Generalized anxiety disorder 04/20/2020   Hyperlipidemia, mixed 04/20/2020   Irritable bowel syndrome with constipation 04/20/2020   Left elbow tendonitis 04/20/2020   Lateral epicondylitis of left elbow 02/19/2019   Mild depression 05/29/2017   SUI (stress urinary incontinence, female) 12/28/2011    Past Surgical History:  Procedure Laterality Date   APPENDECTOMY  1987   CARPOMETACARPAL ([place]) FUSION OF THUMB Right 04/19/2021   Procedure: CARPOMETACARPAL [facility]) FUSION OF THUMB;  Surgeon: Hessie Knows, MD;  Location: [facility] ORS;  Service: Orthopedics;  Laterality: Right;   [place], 2002   x2   COLONOSCOPY WITH ESOPHAGOGASTRODUODENOSCOPY (EGD)  2013   DILITATION & CURRETTAGE/HYSTROSCOPY  WITH NOVASURE ABLATION N/A 10/22/2020   Procedure: Fractional Dilitation and Curattage Aborted Novasure due to cavity width;  Surgeon: Schermerhorn, Gwen Her, MD;  Location: [facility] ORS;  Service: Gynecology;  Laterality: N/A;   HEMORROIDECTOMY  2003   INCONTINENCE SURGERY  2010   TVT sling   MOHS SURGERY Left 03/02/2021   upper lip   TONSILLECTOMY  1986    OB History   No obstetric history on file.      Home Medications    Prior to Admission medications   Medication Sig Start Date End Date Taking? Authorizing Provider  albuterol (VENTOLIN HFA) 108 (90 Base) MCG/ACT inhaler Inhale 1-2 puffs into the lungs every 6 (six) hours as needed for wheezing or shortness of breath. 12/03/21  Yes Sharion Balloon, NP  ALPRAZolam Duanne Moron) 0.5 MG tablet Take 0.25-0.5 mg by mouth 2 (two) times daily as needed for anxiety. 05/20/19   [provider]  atorvastatin (LIPITOR) 20 MG tablet Take 20 mg by mouth at bedtime. 02/18/21   [provider]  DULoxetine (CYMBALTA) 60 MG capsule Take 60 mg by mouth daily. 03/17/20   [provider]  fluticasone (FLONASE) 50 MCG/ACT nasal spray Place 1 spray into both nostrils daily as needed for allergies or rhinitis.    [provider]  ibuprofen (ADVIL) 200 MG tablet Take 4 tablets (800 mg total) by mouth every 6 (six) hours as needed for moderate pain.    [provider]  linaclotide Rolan Lipa) 290  MCG CAPS capsule Take 290 mcg by mouth daily before breakfast.  01/12/20   [provider]  loratadine (CLARITIN) 10 MG tablet Take 10 mg by mouth daily. 02/18/18   [provider]  methylcellulose (CITRUCEL) oral powder Take 1 packet by mouth daily as needed.    [provider]  Multiple Vitamins-Minerals (MULTIVITAMIN WITH MINERALS) tablet Take 1 tablet by mouth daily.    [provider]  oxyCODONE-acetaminophen (PERCOCET) 5-325 MG tablet Take 1 tablet by mouth every 4 (four) hours as needed for severe  pain. 04/19/21 04/19/22  Hessie Knows, MD  traMADol (ULTRAM) 50 MG tablet Take 50 mg by mouth every 6 (six) hours as needed for pain. 01/19/21   [provider]  traZODone (DESYREL) 50 MG tablet Take 25-50 mg by mouth at bedtime as needed. 04/21/20   [provider]    Family History Family History  Problem Relation Age of Onset   Graves' disease Mother    Heart disease Mother    Melanoma Father    Thyroid cancer Father    Diverticulitis Father     Social History Social History   Tobacco Use   Smoking status: Never   Smokeless tobacco: Never  Vaping Use   Vaping Use: Never used  Substance Use Topics   Alcohol use: Yes    Comment: 2-3 drinks daily    Drug use: Not Currently     Allergies   Codeine   Review of Systems Review of Systems  Constitutional:  Negative for chills and fever.  HENT:  Positive for congestion, postnasal drip, rhinorrhea and sore throat. Negative for ear pain.   Respiratory:  Positive for cough. Negative for shortness of breath.   Cardiovascular:  Negative for chest pain and palpitations.  Gastrointestinal:  Negative for diarrhea and vomiting.  Skin:  Negative for color change and rash.  All other systems reviewed and are negative.   Physical Exam Triage Vital Signs ED Triage Vitals [12/03/21 0856]  Enc Vitals Group     BP 122/83     Pulse Rate 93     Resp 18     Temp 99.1 F (37.3 C)     Temp Source Oral     SpO2 97 %     Weight      Height      Head Circumference      Peak Flow      Pain Score      Pain Loc      Pain Edu?      Excl. in [place]?    No data found.  Updated Vital Signs BP 122/83 (BP Location: Left Arm)    Pulse 93    Temp 99.1 F (37.3 C) (Oral)    Resp 18    SpO2 97%   Visual Acuity Right Eye Distance:   Left Eye Distance:   Bilateral Distance:    Right Eye Near:   Left Eye Near:    Bilateral Near:     Physical Exam Vitals and nursing note reviewed.  Constitutional:      General: She is  not in acute distress.    Appearance: She is well-developed.  HENT:     Right Ear: Tympanic membrane normal.     Left Ear: Tympanic membrane normal.     Nose: Rhinorrhea present.     Mouth/Throat:     Mouth: Mucous membranes are moist.     Pharynx: Oropharynx is clear.  Cardiovascular:  Rate and Rhythm: Normal rate and regular rhythm.     Heart sounds: Normal heart sounds.  Pulmonary:     Effort: Pulmonary effort is normal. No respiratory distress.     Breath sounds: Normal breath sounds. No wheezing.  Musculoskeletal:     Cervical back: Neck supple.  Skin:    General: Skin is warm and dry.  Neurological:     Mental Status: She is alert.  Psychiatric:        Mood and Affect: Mood normal.        Behavior: Behavior normal.     UC Treatments / Results  Labs (all labs ordered are listed, but only abnormal results are displayed) Labs Reviewed  NOVEL CORONAVIRUS, NAA    EKG   Radiology No results found.  Procedures Procedures (including critical care time)  Medications Ordered in UC Medications - No data to display  Initial Impression / Assessment and Plan / UC Course  I have reviewed the triage vital signs and the nursing notes.  Pertinent labs & imaging results that were available during my care of the patient were reviewed by me and considered in my medical decision making (see chart for details).    COVID-19, cough.  Treating with albuterol inhaler as patient reports she has needed this with past illnesses.  PCR COVID pending.  Instructed patient to self quarantine per CDC guidelines.  Discussed symptomatic treatment including Tylenol or ibuprofen, rest, hydration.  Instructed patient to follow up with PCP if symptoms are not improving.  Patient agrees to plan of care.   Final Clinical Impressions(s) / UC Diagnoses   Final diagnoses:  COVID-19  Acute cough     Discharge Instructions      You should self-quarantine according to the CDC guidelines.   See attached.    Most people do not need to be re-tested at the end of the quarantine period.    Go to the emergency department if you have high fever, shortness of breath, severe diarrhea, or other concerning symptoms.        ED Prescriptions     Medication Sig Dispense Auth. Provider   albuterol (VENTOLIN HFA) 108 (90 Base) MCG/ACT inhaler Inhale 1-2 puffs into the lungs every 6 (six) hours as needed for wheezing or shortness of breath. 18 g Sharion Balloon, NP      PDMP not reviewed this encounter.   Sharion Balloon, NP 12/03/21 [phone]

## 2021-12-03 NOTE — Discharge Instructions (Addendum)
You should self-quarantine according to the CDC guidelines.  See attached.    Most people do not need to be re-tested at the end of the quarantine period.    Go to the emergency department if you have high fever, shortness of breath, severe diarrhea, or other concerning symptoms.

## 2021-12-03 NOTE — ED Triage Notes (Signed)
Pt here with positive home covid test and congestion and sore throat x 2 days. Pt needs documented PCR test for work and is interested in anti-viral therapy

## 2021-12-04 ENCOUNTER — Ambulatory Visit: Payer: Self-pay

## 2021-12-04 LAB — SARS-COV-2, NAA 2 DAY TAT

## 2021-12-04 LAB — NOVEL CORONAVIRUS, NAA: SARS-CoV-2, NAA: DETECTED — AB

## 2022-07-28 ENCOUNTER — Other Ambulatory Visit: Payer: Self-pay | Admitting: Obstetrics and Gynecology

## 2022-07-28 DIAGNOSIS — Z1231 Encounter for screening mammogram for malignant neoplasm of breast: Secondary | ICD-10-CM

## 2022-08-30 ENCOUNTER — Ambulatory Visit
Admission: RE | Admit: 2022-08-30 | Discharge: 2022-08-30 | Disposition: A | Discharge status: Home / Self Care | Payer: BC Managed Care – PPO | Source: Ambulatory Visit | Attending: Obstetrics and Gynecology | Admitting: Obstetrics and Gynecology

## 2022-08-30 DIAGNOSIS — Z1231 Encounter for screening mammogram for malignant neoplasm of breast: Secondary | ICD-10-CM | POA: Diagnosis present

## 2022-10-08 ENCOUNTER — Ambulatory Visit
Admission: RE | Admit: 2022-10-08 | Discharge: 2022-10-08 | Disposition: A | Discharge status: Home / Self Care | Payer: BC Managed Care – PPO | Source: Ambulatory Visit | Attending: Emergency Medicine | Admitting: Emergency Medicine

## 2022-10-08 VITALS — BP 109/74 | HR 105 | Temp 98.4°F | Resp 18 | Ht 68.0 in | Wt 137.0 lb

## 2022-10-08 DIAGNOSIS — J209 Acute bronchitis, unspecified: Secondary | ICD-10-CM | POA: Diagnosis not present

## 2022-10-08 DIAGNOSIS — J0100 Acute maxillary sinusitis, unspecified: Secondary | ICD-10-CM | POA: Diagnosis not present

## 2022-10-08 MED ORDER — PREDNISONE 10 MG (21) PO TBPK: ORAL_TABLET | Freq: Every day | ORAL | 0 refills | Status: AC

## 2022-10-08 MED ORDER — AMOXICILLIN 875 MG PO TABS: 875.0000 mg | ORAL_TABLET | Freq: Two times a day (BID) | ORAL | 0 refills | Status: AC

## 2022-10-08 NOTE — ED Provider Notes (Signed)
Roderic Palau    CSN: [id number] Arrival date & time: 10/08/22  0917      History   Chief Complaint Chief Complaint  Patient presents with   Nasal Congestion    I've been treating a cold over a week, congestion continues to thicken and be one discolored, pressure in ears/head, cough and shortness of breath. - Entered by patient    HPI Michelle Franco is a 51 y.o. female.  Patient presents with 8-day history of low-grade fever, chills, ear pain, congestion, postnasal drip, cough.  She reports shortness of breath with exertion x 2-3 day; treatment with albuterol inhaler that was previously prescribed.  Tmax 99.  No rash,  vomiting, diarrhea, or other symptoms.  Treatment at home with ibuprofen, guaifenesin, NyQuil; no OTC medications taken today.  Negative COVID test at home.  Her medical history includes hyperlipidemia, GERD, IBS, anxiety, depression.  The history is provided by the patient and medical records.    Past Medical History:  Diagnosis Date   Anxiety    Basal cell carcinoma (BCC) of upper lip 02/2021   left   Depression    Family history of adverse reaction to anesthesia    father very sleepy after   GERD (gastroesophageal reflux disease)    Hemorrhoids    Hyperlipidemia     Patient Active Problem List   Diagnosis Date Noted   PMB (postmenopausal bleeding) 05/14/2020   Generalized anxiety disorder 04/20/2020   Hyperlipidemia, mixed 04/20/2020   Irritable bowel syndrome with constipation 04/20/2020   Left elbow tendonitis 04/20/2020   Lateral epicondylitis of left elbow 02/19/2019   Mild depression 05/29/2017   SUI (stress urinary incontinence, female) 12/28/2011    Past Surgical History:  Procedure Laterality Date   APPENDECTOMY  1987   CARPOMETACARPAL ([place]) FUSION OF THUMB Right 04/19/2021   Procedure: CARPOMETACARPAL [facility]) FUSION OF THUMB;  Surgeon: Hessie Knows, MD;  Location: [facility] OR[facility];  Service: Orthopedics;  Laterality: Right;   [place], 2002   x2   COLONOSCOPY WITH ESOPHAGOGASTRODUODENOSCOPY (EGD)  2013   DILITATION & CURRETTAGE/HYSTROSCOPY WITH NOVASURE ABLATION N/A 10/22/2020   Procedure: Fractional Dilitation and Curattage Aborted Novasure due to cavity width;  Surgeon: Schermerhorn, Gwen Her, MD;  Location: [facility];  Service: Gynecology;  Laterality: N/A;   HEMORROIDECTOMY  2003   INCONTINENCE SURGERY  2010   TVT sling   MOHS SURGERY Left 03/02/2021   upper lip   TONSILLECTOMY  1986    OB History   No obstetric history on file.      Home Medications    Prior to Admission medications   Medication Sig Start Date End Date Taking? Authorizing Provider  amoxicillin (AMOXIL) 875 MG tablet Take 1 tablet (875 mg total) by mouth 2 (two) times daily for 7 days. 10/08/22 10/15/22 Yes Sharion Balloon, NP  predniSONE (STERAPRED UNI-PAK 21 TAB) 10 MG (21) TBPK tablet Take by mouth daily. As directed 10/08/22  Yes Sharion Balloon, NP  albuterol (VENTOLIN HFA) 108 (90 Base) MCG/ACT inhaler Inhale 1-2 puffs into the lungs every 6 (six) hours as needed for wheezing or shortness of breath. 12/03/21   Sharion Balloon, NP  ALPRAZolam Duanne Moron) 0.5 MG tablet Take 0.25-0.5 mg by mouth 2 (two) times daily as needed for anxiety. 05/20/19   [provider]  atorvastatin (LIPITOR) 20 MG tablet Take 20 mg by mouth at bedtime. 02/18/21   [provider]  DULoxetine (CYMBALTA) 60 MG capsule Take 60 mg  by mouth daily. 03/17/20   [provider]  fluticasone (FLONASE) 50 MCG/ACT nasal spray Place 1 spray into both nostrils daily as needed for allergies or rhinitis.    [provider]  ibuprofen (ADVIL) 200 MG tablet Take 4 tablets (800 mg total) by mouth every 6 (six) hours as needed for moderate pain.    [provider]  linaclotide Rolan Lipa) 290 MCG CAPS capsule Take 290 mcg by mouth daily before breakfast.  01/12/20   [provider]  loratadine (CLARITIN) 10 MG tablet Take 10 mg by mouth daily.  02/18/18   [provider]  methylcellulose (CITRUCEL) oral powder Take 1 packet by mouth daily as needed.    [provider]  Multiple Vitamins-Minerals (MULTIVITAMIN WITH MINERALS) tablet Take 1 tablet by mouth daily.    [provider]  traMADol (ULTRAM) 50 MG tablet Take 50 mg by mouth every 6 (six) hours as needed for pain. 01/19/21   [provider]  traZODone (DESYREL) 50 MG tablet Take 25-50 mg by mouth at bedtime as needed. 04/21/20   [provider]    Family History Family History  Problem Relation Age of Onset   Graves' disease Mother    Heart disease Mother    Melanoma Father    Thyroid cancer Father    Diverticulitis Father     Social History Social History   Tobacco Use   Smoking status: Never   Smokeless tobacco: Never  Vaping Use   Vaping Use: Never used  Substance Use Topics   Alcohol use: Yes    Comment: 2-3 drinks daily    Drug use: Not Currently     Allergies   Codeine   Review of Systems Review of Systems  Constitutional:  Positive for chills and fever.  HENT:  Positive for congestion, ear pain, postnasal drip, rhinorrhea and sinus pressure. Negative for sore throat.   Respiratory:  Positive for cough and shortness of breath.   Cardiovascular:  Negative for chest pain and palpitations.  Gastrointestinal:  Negative for diarrhea and vomiting.  Skin:  Negative for color change and rash.  All other systems reviewed and are negative.    Physical Exam Triage Vital Signs ED Triage Vitals [10/08/22 0937]  Enc Vitals Group     BP      Pulse Rate (!) 105     Resp 18     Temp 98.4 F (36.9 C)     Temp src      SpO2 97 %     Weight      Height      Head Circumference      Peak Flow      Pain Score      Pain Loc      Pain Edu?      Excl. in [place]?    No data found.  Updated Vital Signs BP 109/74   Pulse (!) 105   Temp 98.4 F (36.9 C)   Resp 18   Ht '5\' 8"'$  (1.727 m)   Wt 137 lb (62.1 kg)    SpO2 97%   BMI 20.83 kg/m   Visual Acuity Right Eye Distance:   Left Eye Distance:   Bilateral Distance:    Right Eye Near:   Left Eye Near:    Bilateral Near:     Physical Exam Vitals and nursing note reviewed.  Constitutional:      General: She is not in acute distress.    Appearance: Normal appearance.  She is well-developed. She is not ill-appearing.  HENT:     Right Ear: Tympanic membrane normal.     Left Ear: Tympanic membrane normal.     Nose: Congestion and rhinorrhea present.     Mouth/Throat:     Mouth: Mucous membranes are moist.     Pharynx: Oropharynx is clear.  Cardiovascular:     Rate and Rhythm: Normal rate and regular rhythm.     Heart sounds: Normal heart sounds.  Pulmonary:     Effort: Pulmonary effort is normal. No respiratory distress.     Breath sounds: Normal breath sounds.  Musculoskeletal:     Cervical back: Neck supple.  Skin:    General: Skin is warm and dry.  Neurological:     Mental Status: She is alert.  Psychiatric:        Mood and Affect: Mood normal.        Behavior: Behavior normal.      UC Treatments / Results  Labs (all labs ordered are listed, but only abnormal results are displayed) Labs Reviewed - No data to display  EKG   Radiology No results found.  Procedures Procedures (including critical care time)  Medications Ordered in UC Medications - No data to display  Initial Impression / Assessment and Plan / UC Course  I have reviewed the triage vital signs and the nursing notes.  Pertinent labs & imaging results that were available during my care of the patient were reviewed by me and considered in my medical decision making (see chart for details).    Acute sinusitis and bronchitis.  Patient has been symptomatic for 8 days and is getting worse despite treatment with multiple OTC medications.  Treating today with amoxicillin and prednisone.  Instructed patient to continue using her albuterol inhaler as needed.   Instructed her to follow-up with her PCP if her symptoms are not improving.  Education provided on sinusitis and bronchitis.  Patient agrees to plan of care.  Final Clinical Impressions(s) / UC Diagnoses   Final diagnoses:  Acute non-recurrent maxillary sinusitis  Acute bronchitis, unspecified organism     Discharge Instructions      Take the amoxicillin and prednisone as directed.  Follow up with your primary care provider if your symptoms are not improving.        ED Prescriptions     Medication Sig Dispense Auth. Provider   amoxicillin (AMOXIL) 875 MG tablet Take 1 tablet (875 mg total) by mouth 2 (two) times daily for 7 days. 14 tablet Sharion Balloon, NP   predniSONE (STERAPRED UNI-PAK 21 TAB) 10 MG (21) TBPK tablet Take by mouth daily. As directed 21 tablet Sharion Balloon, NP      PDMP not reviewed this encounter.   Sharion Balloon, NP 10/08/22 1016

## 2022-10-08 NOTE — Discharge Instructions (Addendum)
Take the amoxicillin and prednisone as directed.  Follow up with your primary care provider if your symptoms are not improving.

## 2022-10-08 NOTE — ED Triage Notes (Addendum)
Patient to Urgent Care with complaints of cough and congestion. Reports thickening congestion and discolored nasal drainage. Reports pressure in her ears and head. Reports some chills, max temp was 99. Right sided ear pain, left sided ear pressure.  Symptoms started 11/25.   Several negative home covid tests.  Has been taking guaifenesin, ibuprofen, and nyquil.

## 2023-04-08 IMAGING — MG MM DIGITAL SCREENING BILAT W/ TOMO AND CAD
8 series · 9 of 24 positions shown · non-contrast
Comparison: Previous exam(s).

CLINICAL DATA: Screening.

EXAM:
DIGITAL SCREENING BILATERAL MAMMOGRAM WITH TOMOSYNTHESIS AND CAD
TECHNIQUE: Bilateral screening digital craniocaudal and mediolateral oblique
mammograms were obtained. Bilateral screening digital breast
tomosynthesis was performed. The images were evaluated with
computer-aided detection.

[R MLO synth-2D]
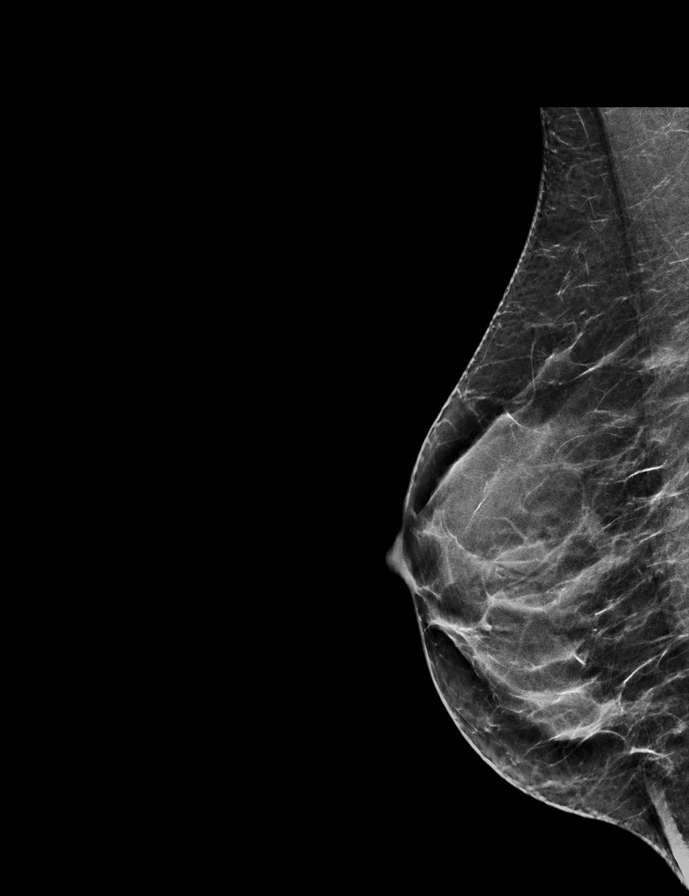

[L MLO synth-2D]
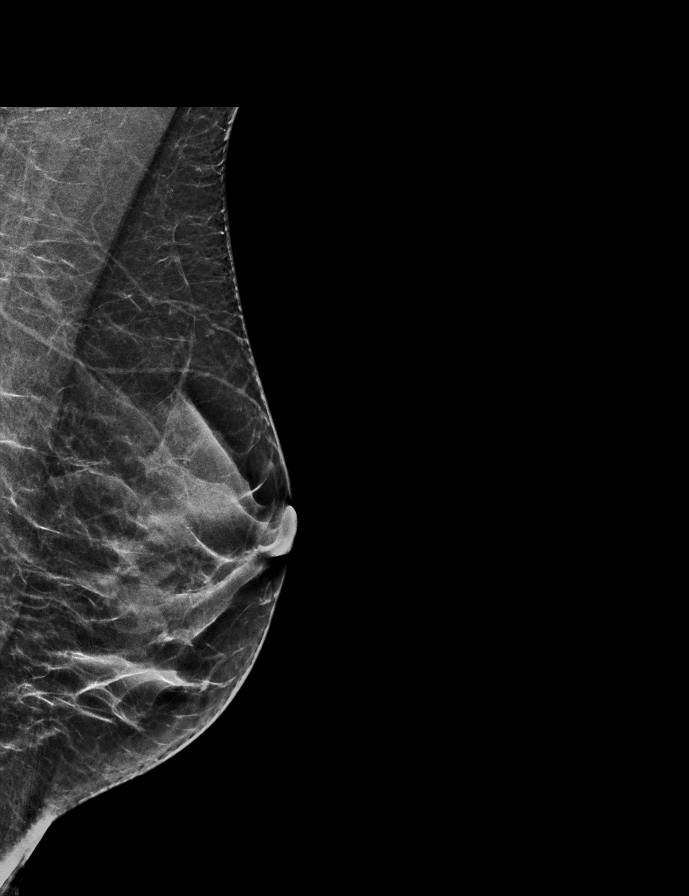

[L CC synth-2D]
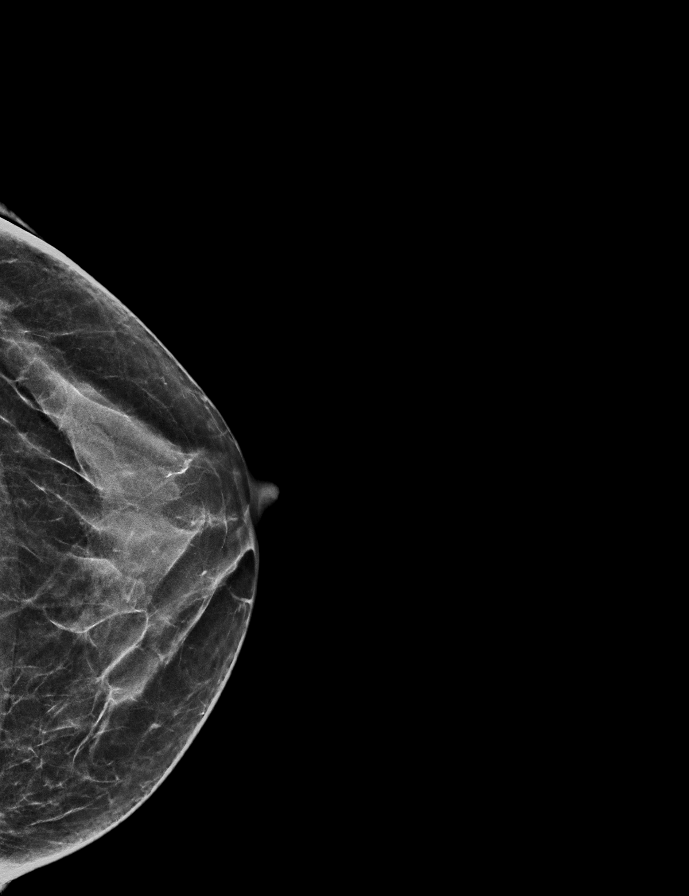

[R CC synth-2D]
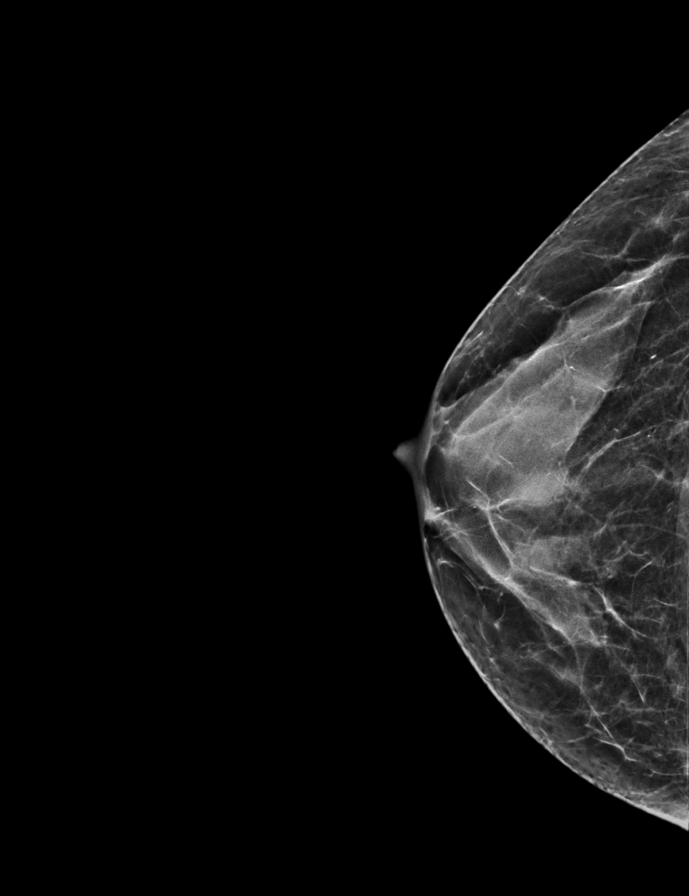

[R CC tomo · 2 of 44 frames shown]
[frame 15/44]
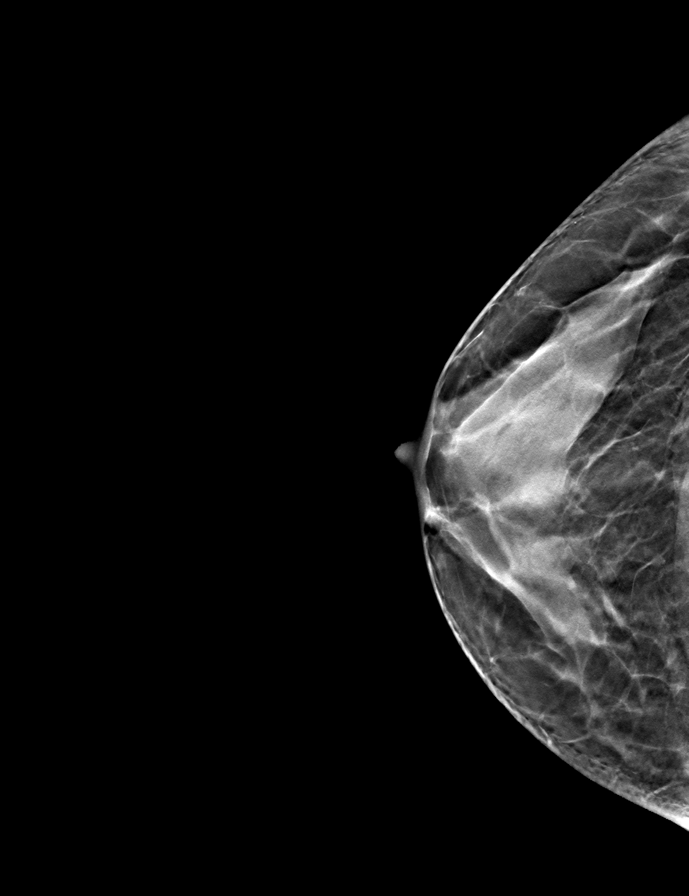
[frame 23/44]
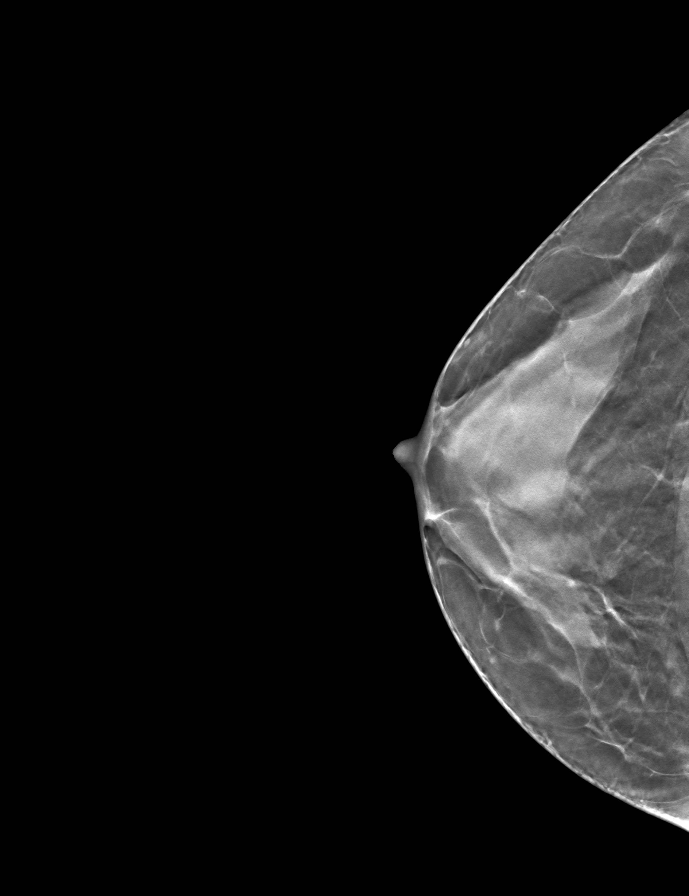

[R MLO tomo · tomo slice 25/48.0]
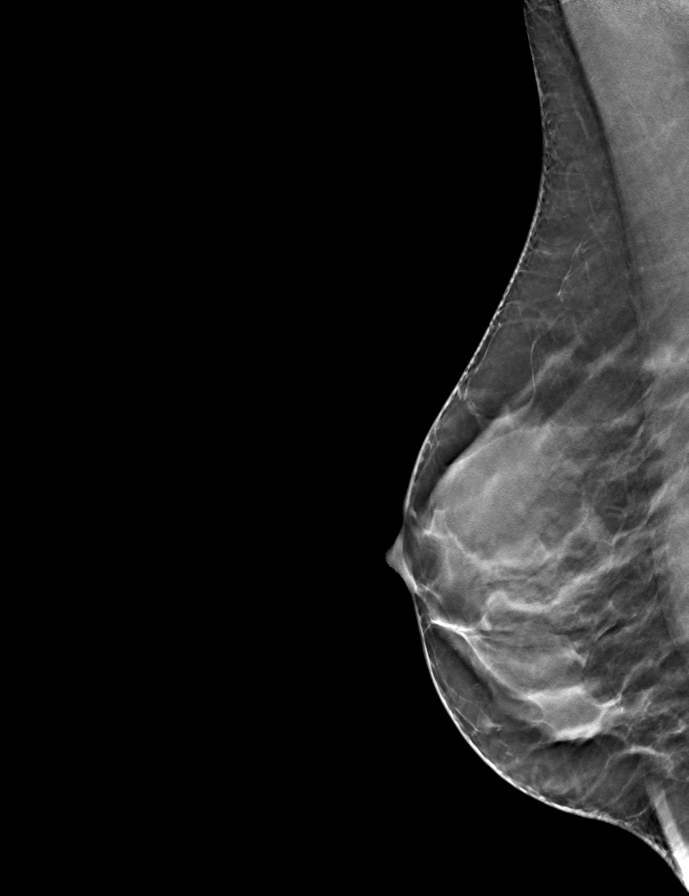

[L MLO tomo · tomo slice 25/48.0]
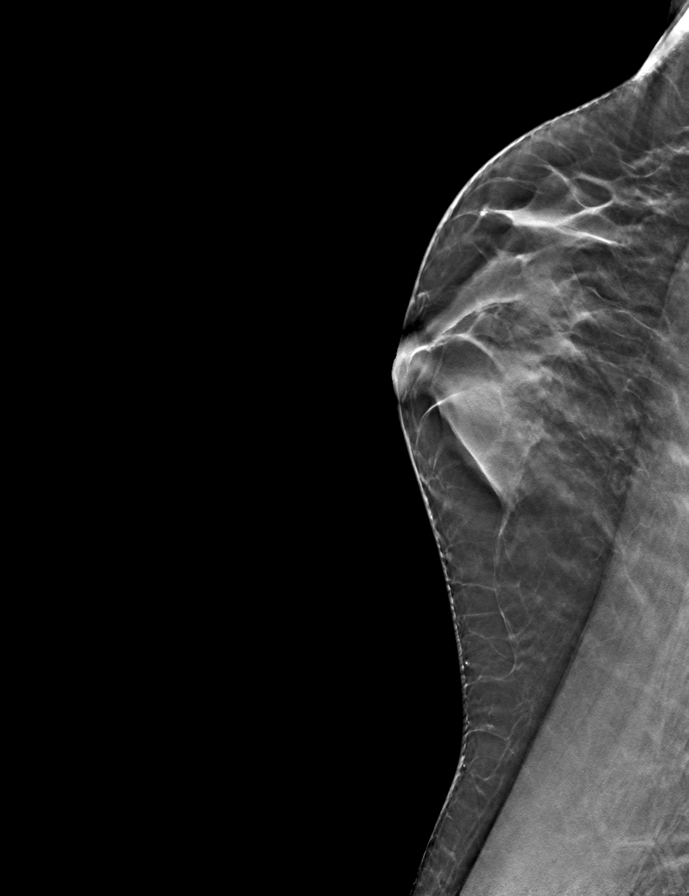

[L CC tomo · tomo slice 25/49.0]
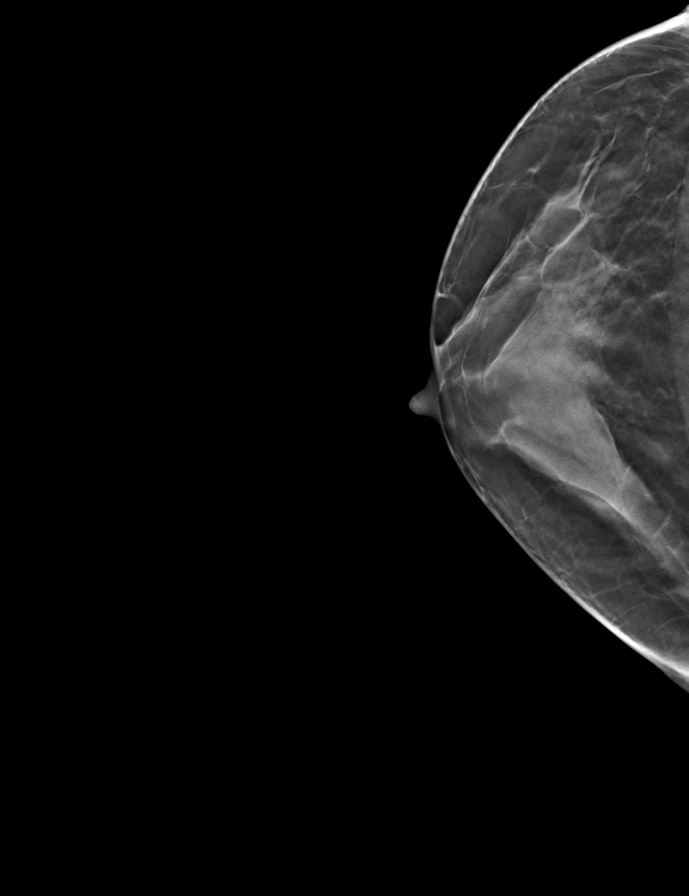

[9 of 24 positions shown; findings below may reference images not displayed]

ACR Breast Density Category c: The breast tissue is heterogeneously
dense, which may obscure small masses.
FINDINGS: There are no findings suspicious for malignancy.
IMPRESSION: No mammographic evidence of malignancy. A result letter of this
screening mammogram will be mailed directly to the patient.

RECOMMENDATION:
Screening mammogram in one year. (Code:Q3-W-BC3)

BI-RADS CATEGORY  1: Negative.

## 2023-10-03 ENCOUNTER — Other Ambulatory Visit: Payer: Self-pay | Admitting: Obstetrics and Gynecology

## 2023-10-03 DIAGNOSIS — Z1231 Encounter for screening mammogram for malignant neoplasm of breast: Secondary | ICD-10-CM

## 2023-10-23 ENCOUNTER — Ambulatory Visit
Admission: RE | Admit: 2023-10-23 | Discharge: 2023-10-23 | Disposition: A | Discharge status: Home / Self Care | Payer: BC Managed Care – PPO | Source: Ambulatory Visit | Attending: Obstetrics and Gynecology | Admitting: Obstetrics and Gynecology

## 2023-10-23 DIAGNOSIS — Z1231 Encounter for screening mammogram for malignant neoplasm of breast: Secondary | ICD-10-CM | POA: Diagnosis present

## 2024-09-22 ENCOUNTER — Other Ambulatory Visit: Payer: Self-pay | Admitting: Obstetrics and Gynecology

## 2024-09-22 DIAGNOSIS — Z1231 Encounter for screening mammogram for malignant neoplasm of breast: Secondary | ICD-10-CM
